# Patient Record
Sex: Female | Born: 1955
Health system: Southern US, Community
[De-identification: ages and names within clinical notes are randomized; demographics above are authoritative.]

## PROBLEM LIST (undated history)

## (undated) DIAGNOSIS — N951 Menopausal and female climacteric states: Secondary | ICD-10-CM

## (undated) DIAGNOSIS — E785 Hyperlipidemia, unspecified: Secondary | ICD-10-CM

## (undated) DIAGNOSIS — F419 Anxiety disorder, unspecified: Secondary | ICD-10-CM

## (undated) DIAGNOSIS — I10 Essential (primary) hypertension: Secondary | ICD-10-CM

## (undated) DIAGNOSIS — E119 Type 2 diabetes mellitus without complications: Secondary | ICD-10-CM

## (undated) DIAGNOSIS — G43909 Migraine, unspecified, not intractable, without status migrainosus: Secondary | ICD-10-CM

## (undated) DIAGNOSIS — G4700 Insomnia, unspecified: Secondary | ICD-10-CM

## (undated) HISTORY — DX: Insomnia, unspecified: G47.00

## (undated) HISTORY — DX: Menopausal and female climacteric states: N95.1

## (undated) HISTORY — PX: TUBAL LIGATION: SHX77

## (undated) HISTORY — DX: Migraine, unspecified, not intractable, without status migrainosus: G43.909

## (undated) HISTORY — DX: Type 2 diabetes mellitus without complications: E11.9

## (undated) HISTORY — DX: Hyperlipidemia, unspecified: E78.5

## (undated) HISTORY — DX: Essential (primary) hypertension: I10

## (undated) HISTORY — DX: Anxiety disorder, unspecified: F41.9

---

## 2002-11-14 ENCOUNTER — Encounter: Payer: Self-pay | Admitting: Internal Medicine

## 2004-02-28 HISTORY — PX: COLONOSCOPY W/ POLYPECTOMY: SHX1380

## 2004-06-22 ENCOUNTER — Encounter: Admission: RE | Admit: 2004-06-22 | Discharge: 2004-06-22 | Payer: Self-pay | Admitting: Obstetrics and Gynecology

## 2004-07-01 ENCOUNTER — Encounter: Admission: RE | Admit: 2004-07-01 | Discharge: 2004-07-01 | Payer: Self-pay | Admitting: Internal Medicine

## 2004-07-01 ENCOUNTER — Ambulatory Visit: Payer: Self-pay | Admitting: Internal Medicine

## 2004-07-04 ENCOUNTER — Ambulatory Visit: Payer: Self-pay | Admitting: Internal Medicine

## 2004-07-14 ENCOUNTER — Ambulatory Visit: Payer: Self-pay | Admitting: Internal Medicine

## 2004-07-28 ENCOUNTER — Ambulatory Visit: Payer: Self-pay | Admitting: Internal Medicine

## 2004-09-02 ENCOUNTER — Encounter (INDEPENDENT_AMBULATORY_CARE_PROVIDER_SITE_OTHER): Payer: Self-pay | Admitting: *Deleted

## 2004-09-02 ENCOUNTER — Ambulatory Visit (HOSPITAL_COMMUNITY): Admission: RE | Admit: 2004-09-02 | Discharge: 2004-09-02 | Payer: Self-pay | Admitting: Gastroenterology

## 2004-09-06 ENCOUNTER — Ambulatory Visit: Payer: Self-pay | Admitting: Internal Medicine

## 2005-01-24 ENCOUNTER — Ambulatory Visit: Payer: Self-pay | Admitting: Internal Medicine

## 2005-02-23 ENCOUNTER — Ambulatory Visit: Payer: Self-pay | Admitting: Internal Medicine

## 2005-02-27 HISTORY — PX: KNEE ARTHROSCOPY: SHX127

## 2005-08-17 ENCOUNTER — Encounter: Admission: RE | Admit: 2005-08-17 | Discharge: 2005-08-17 | Payer: Self-pay | Admitting: Obstetrics and Gynecology

## 2005-09-14 ENCOUNTER — Encounter: Admission: RE | Admit: 2005-09-14 | Discharge: 2005-09-14 | Payer: Self-pay | Admitting: Obstetrics and Gynecology

## 2005-12-26 ENCOUNTER — Ambulatory Visit: Payer: Self-pay | Admitting: Internal Medicine

## 2006-01-09 ENCOUNTER — Ambulatory Visit: Payer: Self-pay | Admitting: Internal Medicine

## 2006-01-09 LAB — CONVERTED CEMR LAB
ALT: 46 units/L — ABNORMAL HIGH (ref 0–40)
Albumin: 4.4 g/dL (ref 3.5–5.2)
Alkaline Phosphatase: 54 units/L (ref 39–117)
CO2: 30 meq/L (ref 19–32)
Calcium: 10 mg/dL (ref 8.4–10.5)
Chloride: 100 meq/L (ref 96–112)
Chol/HDL Ratio, serum: 4.3
Cholesterol: 141 mg/dL (ref 0–200)
Creatinine, Ser: 0.9 mg/dL (ref 0.4–1.2)
GFR calc non Af Amer: 70 mL/min
Glucose, Bld: 115 mg/dL — ABNORMAL HIGH (ref 70–99)
Potassium: 3.5 meq/L (ref 3.5–5.1)
Sodium: 140 meq/L (ref 135–145)

## 2006-04-08 IMAGING — CT CT HEAD W/O CM
1 series · 16 of 28 positions shown, 20 images · non-contrast
Comparison: none

CLINICAL DATA: Headache.  
 CT HEAD WITHOUT CONTRAST ? 07/01/04:
TECHNIQUE: Multidetector helical CT scanning was obtained through the head.
 No comparison films available.

[Series 2: brain · axial · 0.49mm/px · z∈[+51,+183]mm · 16 of 28 slices shown, 20 images]
[im 2/28  brain]
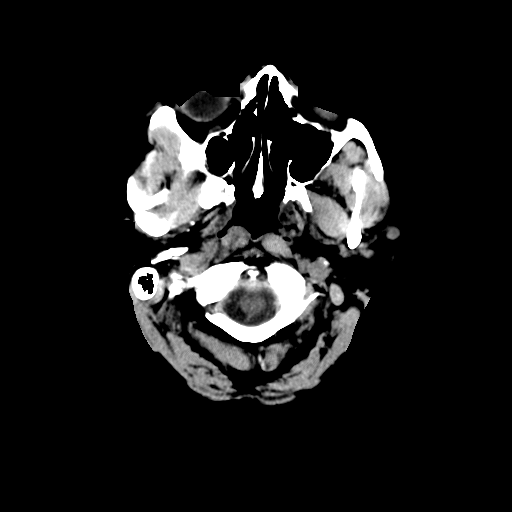
[im 2/28  bone]
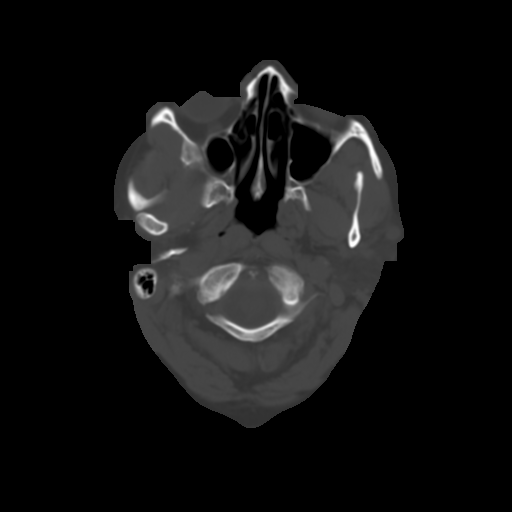
[im 4/28  brain]
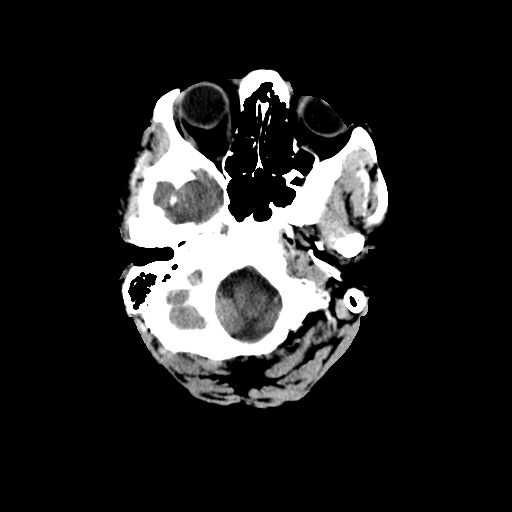
[im 6/28  brain]
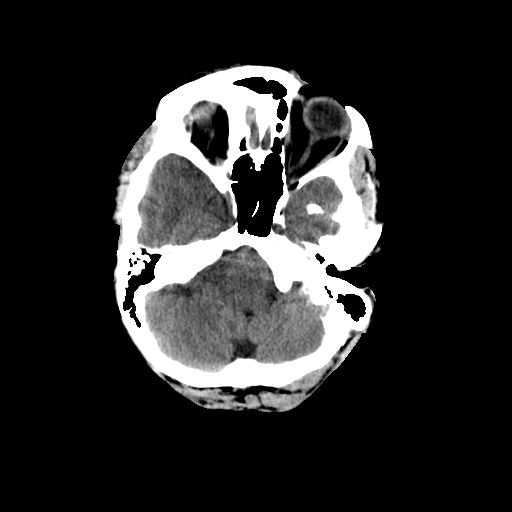
[im 7/28  brain]
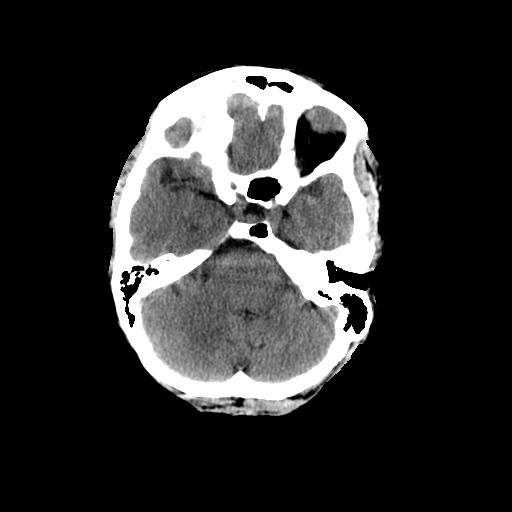
[im 9/28  brain]
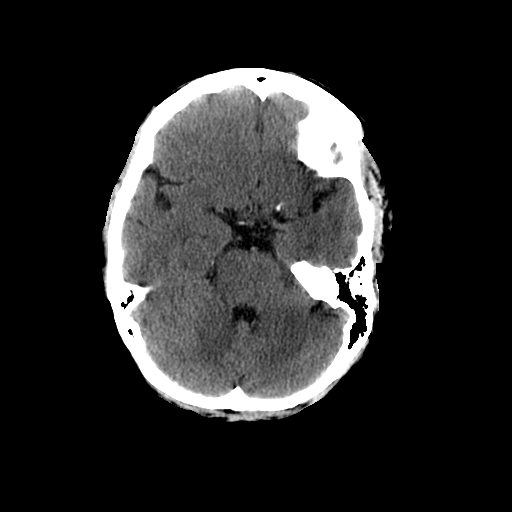
[im 9/28  bone]
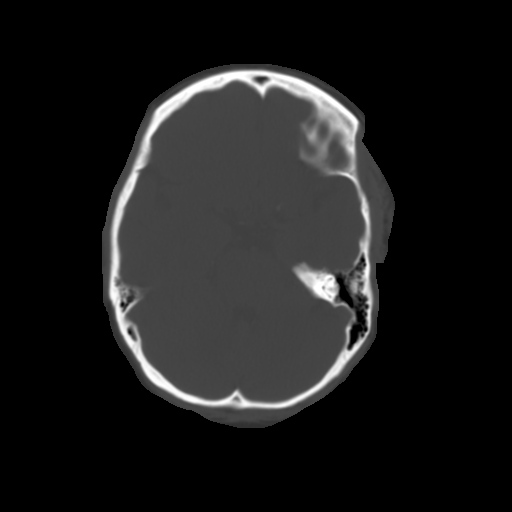
[im 10/28  brain]
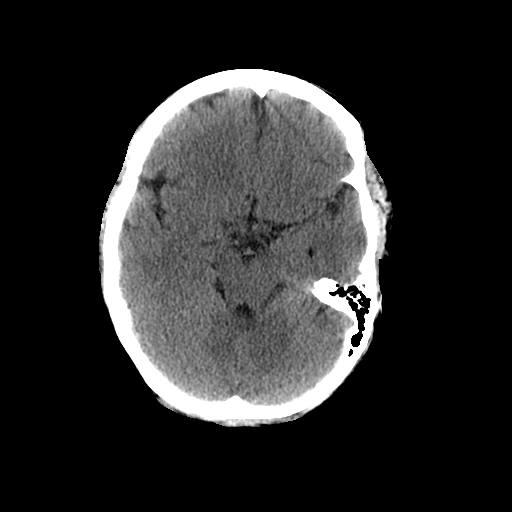
[im 12/28  brain]
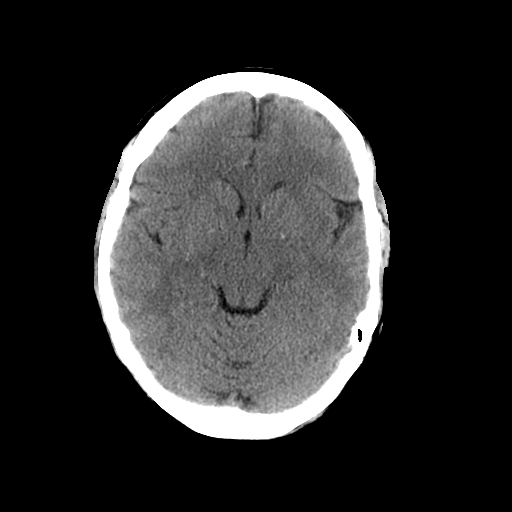
[im 14/28  brain]
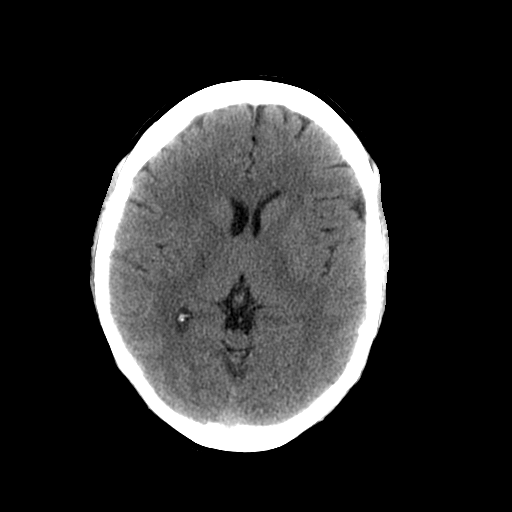
[im 15/28  brain]
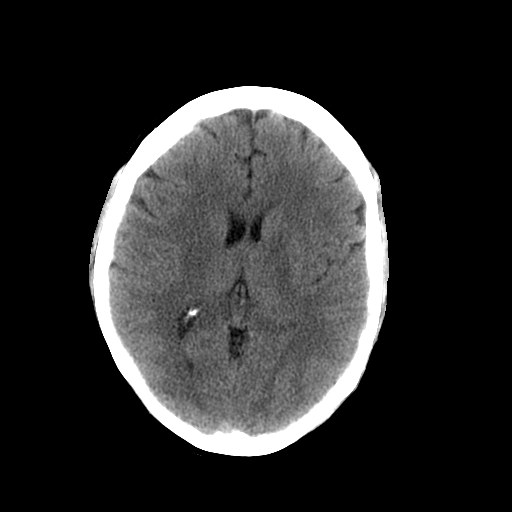
[im 15/28  bone]
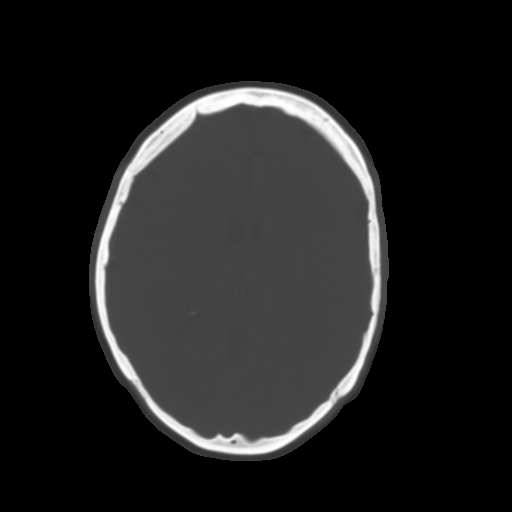
[im 17/28  brain]
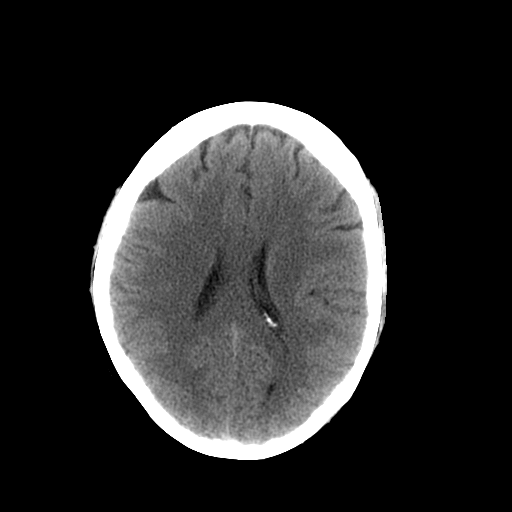
[im 19/28  brain]
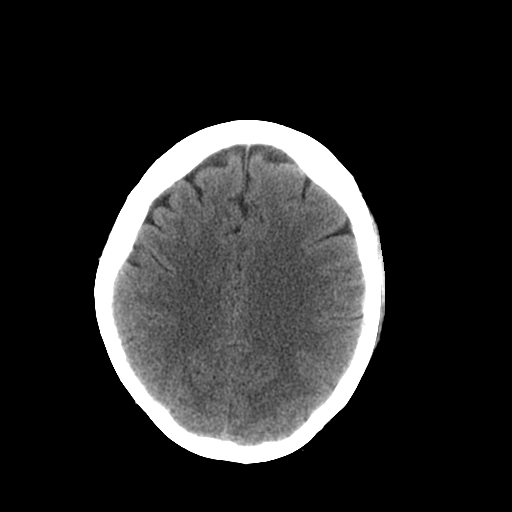
[im 20/28  brain]
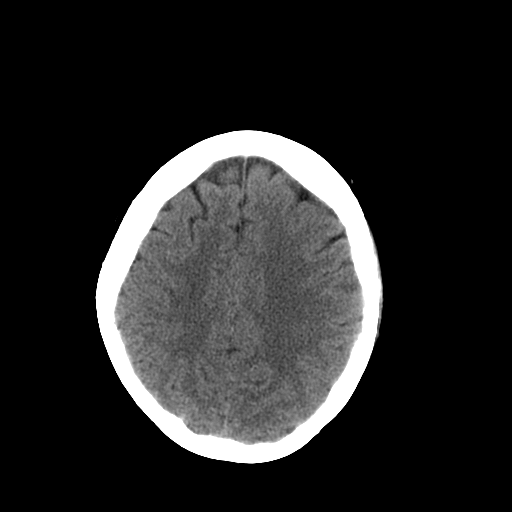
[im 22/28  brain]
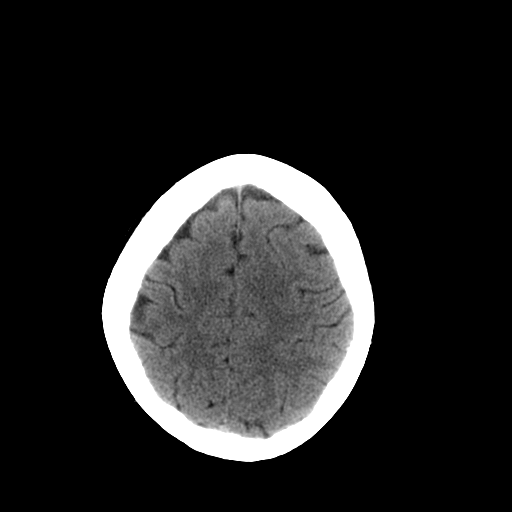
[im 22/28  bone]
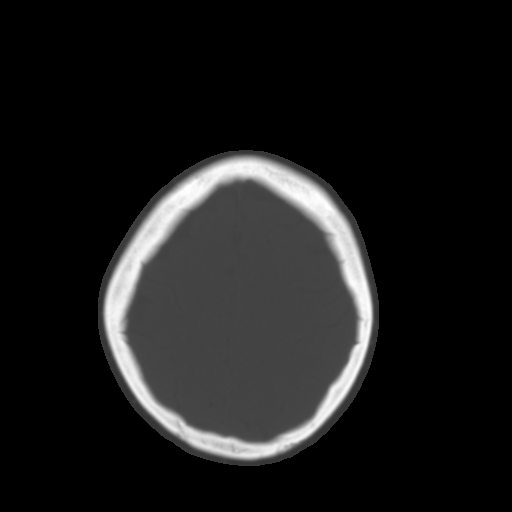
[im 23/28  brain]
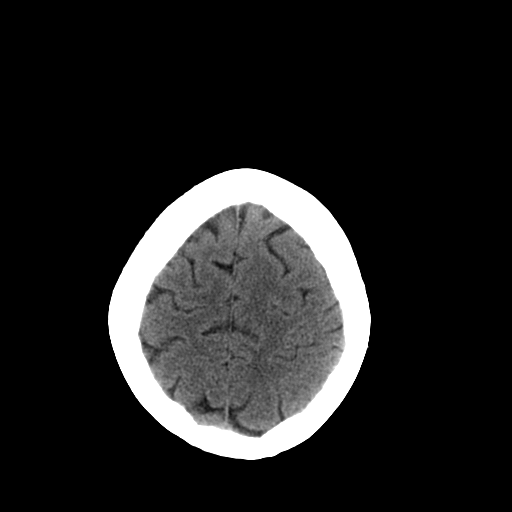
[im 25/28  brain]
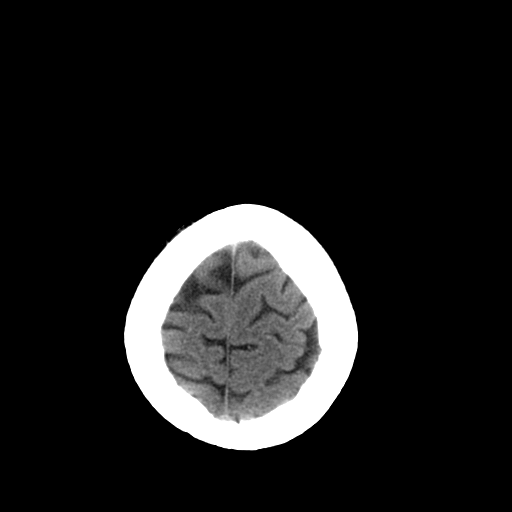
[im 27/28  brain]
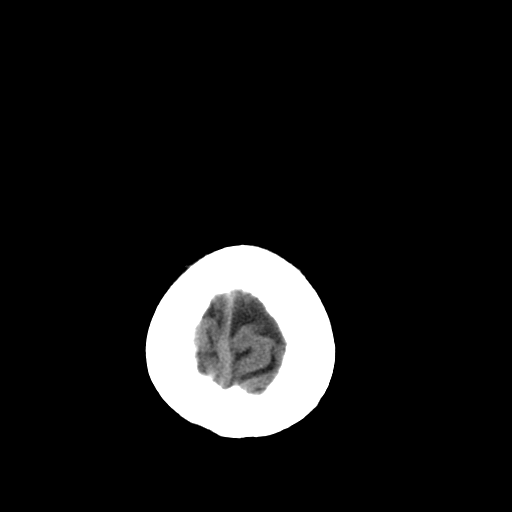

[16 of 28 positions shown; findings below may reference images not displayed]

FINDINGS: No evidence of acute intracranial abnormality including mass or mass effect, hydrocephalus, extra-axial fluid collection, midline shift, hemorrhage, or infarct.   Acute infarct may be missed by CT for 24-48 hours. 
 The visualized bony calvarium and paranasal sinuses are unremarkable.
IMPRESSION: No evidence of acute intracranial abnormality.

## 2006-04-09 ENCOUNTER — Ambulatory Visit: Payer: Self-pay | Admitting: Internal Medicine

## 2006-04-09 LAB — CONVERTED CEMR LAB
BUN: 22 mg/dL (ref 6–23)
CO2: 33 meq/L — ABNORMAL HIGH (ref 19–32)
Calcium: 9.5 mg/dL (ref 8.4–10.5)
Chloride: 97 meq/L (ref 96–112)
Creatinine, Ser: 0.7 mg/dL (ref 0.4–1.2)
Glucose, Bld: 96 mg/dL (ref 70–99)
Potassium: 3.7 meq/L (ref 3.5–5.1)
Sodium: 139 meq/L (ref 135–145)

## 2006-09-12 ENCOUNTER — Encounter: Admission: RE | Admit: 2006-09-12 | Discharge: 2006-09-12 | Payer: Self-pay | Admitting: Obstetrics and Gynecology

## 2006-12-28 ENCOUNTER — Encounter (INDEPENDENT_AMBULATORY_CARE_PROVIDER_SITE_OTHER): Payer: Self-pay | Admitting: *Deleted

## 2007-04-16 ENCOUNTER — Ambulatory Visit: Payer: Self-pay | Admitting: Internal Medicine

## 2007-04-16 ENCOUNTER — Telehealth (INDEPENDENT_AMBULATORY_CARE_PROVIDER_SITE_OTHER): Payer: Self-pay | Admitting: *Deleted

## 2007-04-16 DIAGNOSIS — G4700 Insomnia, unspecified: Secondary | ICD-10-CM

## 2007-04-16 DIAGNOSIS — F411 Generalized anxiety disorder: Secondary | ICD-10-CM

## 2007-04-16 DIAGNOSIS — I10 Essential (primary) hypertension: Secondary | ICD-10-CM

## 2007-04-16 DIAGNOSIS — N951 Menopausal and female climacteric states: Secondary | ICD-10-CM

## 2007-04-16 DIAGNOSIS — G43909 Migraine, unspecified, not intractable, without status migrainosus: Secondary | ICD-10-CM | POA: Insufficient documentation

## 2007-04-16 DIAGNOSIS — E785 Hyperlipidemia, unspecified: Secondary | ICD-10-CM

## 2007-05-24 ENCOUNTER — Telehealth (INDEPENDENT_AMBULATORY_CARE_PROVIDER_SITE_OTHER): Payer: Self-pay | Admitting: *Deleted

## 2007-06-19 ENCOUNTER — Ambulatory Visit: Payer: Self-pay | Admitting: Internal Medicine

## 2007-06-25 ENCOUNTER — Telehealth (INDEPENDENT_AMBULATORY_CARE_PROVIDER_SITE_OTHER): Payer: Self-pay | Admitting: *Deleted

## 2007-06-26 ENCOUNTER — Telehealth (INDEPENDENT_AMBULATORY_CARE_PROVIDER_SITE_OTHER): Payer: Self-pay | Admitting: *Deleted

## 2007-06-26 DIAGNOSIS — R739 Hyperglycemia, unspecified: Secondary | ICD-10-CM

## 2007-07-18 ENCOUNTER — Encounter: Admission: RE | Admit: 2007-07-18 | Discharge: 2007-07-18 | Payer: Self-pay | Admitting: Internal Medicine

## 2007-07-25 ENCOUNTER — Ambulatory Visit: Payer: Self-pay | Admitting: Cardiovascular Disease

## 2007-07-28 ENCOUNTER — Encounter: Payer: Self-pay | Admitting: Internal Medicine

## 2007-08-05 ENCOUNTER — Ambulatory Visit: Payer: Self-pay

## 2007-08-05 ENCOUNTER — Encounter: Payer: Self-pay | Admitting: Cardiovascular Disease

## 2007-08-05 ENCOUNTER — Encounter: Payer: Self-pay | Admitting: Internal Medicine

## 2007-10-09 ENCOUNTER — Encounter: Admission: RE | Admit: 2007-10-09 | Discharge: 2007-10-09 | Payer: Self-pay | Admitting: Internal Medicine

## 2007-10-11 ENCOUNTER — Encounter (INDEPENDENT_AMBULATORY_CARE_PROVIDER_SITE_OTHER): Payer: Self-pay | Admitting: *Deleted

## 2007-10-14 ENCOUNTER — Ambulatory Visit: Payer: Self-pay | Admitting: Cardiovascular Disease

## 2007-10-14 ENCOUNTER — Ambulatory Visit: Payer: Self-pay

## 2007-11-25 ENCOUNTER — Encounter (INDEPENDENT_AMBULATORY_CARE_PROVIDER_SITE_OTHER): Payer: Self-pay | Admitting: *Deleted

## 2007-11-28 ENCOUNTER — Ambulatory Visit: Payer: Self-pay | Admitting: Internal Medicine

## 2007-11-28 LAB — CONVERTED CEMR LAB
ALT: 40 units/L — ABNORMAL HIGH (ref 0–35)
AST: 24 units/L (ref 0–37)
Cholesterol: 125 mg/dL (ref 0–200)
HDL: 41 mg/dL (ref >39.0)
Triglycerides: 89 mg/dL (ref 0–149)

## 2008-05-13 DIAGNOSIS — I498 Other specified cardiac arrhythmias: Secondary | ICD-10-CM

## 2008-05-13 DIAGNOSIS — Z9189 Other specified personal risk factors, not elsewhere classified: Secondary | ICD-10-CM

## 2008-05-13 DIAGNOSIS — R011 Cardiac murmur, unspecified: Secondary | ICD-10-CM | POA: Insufficient documentation

## 2008-05-13 DIAGNOSIS — R609 Edema, unspecified: Secondary | ICD-10-CM

## 2008-05-13 DIAGNOSIS — E7800 Pure hypercholesterolemia, unspecified: Secondary | ICD-10-CM

## 2008-05-14 ENCOUNTER — Ambulatory Visit: Payer: Self-pay | Admitting: Cardiovascular Disease

## 2008-05-20 ENCOUNTER — Telehealth (INDEPENDENT_AMBULATORY_CARE_PROVIDER_SITE_OTHER): Payer: Self-pay | Admitting: *Deleted

## 2008-06-08 ENCOUNTER — Telehealth: Payer: Self-pay | Admitting: Cardiovascular Disease

## 2008-07-08 ENCOUNTER — Telehealth: Payer: Self-pay | Admitting: Cardiovascular Disease

## 2008-11-10 ENCOUNTER — Telehealth (INDEPENDENT_AMBULATORY_CARE_PROVIDER_SITE_OTHER): Payer: Self-pay | Admitting: *Deleted

## 2008-11-16 ENCOUNTER — Ambulatory Visit: Payer: Self-pay | Admitting: Internal Medicine

## 2008-11-18 ENCOUNTER — Encounter (INDEPENDENT_AMBULATORY_CARE_PROVIDER_SITE_OTHER): Payer: Self-pay | Admitting: *Deleted

## 2008-11-18 LAB — CONVERTED CEMR LAB
ALT: 39 units/L — ABNORMAL HIGH (ref 0–35)
AST: 29 units/L (ref 0–37)
Chloride: 105 meq/L (ref 96–112)
Cholesterol: 191 mg/dL (ref 0–200)
HDL: 48.5 mg/dL (ref >39.00)
Hemoglobin: 15 g/dL (ref 12.0–15.0)
Sodium: 144 meq/L (ref 135–145)
TSH: 2.83 microintl units/mL (ref 0.35–5.50)
Total CHOL/HDL Ratio: 4
Triglycerides: 97 mg/dL (ref 0.0–149.0)
VLDL: 19.4 mg/dL (ref 0.0–40.0)

## 2009-02-12 ENCOUNTER — Encounter: Admission: RE | Admit: 2009-02-12 | Discharge: 2009-02-12 | Payer: Self-pay | Admitting: Obstetrics and Gynecology

## 2009-03-24 ENCOUNTER — Encounter (INDEPENDENT_AMBULATORY_CARE_PROVIDER_SITE_OTHER): Payer: Self-pay | Admitting: *Deleted

## 2009-05-17 ENCOUNTER — Ambulatory Visit: Payer: Self-pay | Admitting: Internal Medicine

## 2009-06-03 ENCOUNTER — Telehealth: Payer: Self-pay | Admitting: Internal Medicine

## 2009-06-04 ENCOUNTER — Telehealth (INDEPENDENT_AMBULATORY_CARE_PROVIDER_SITE_OTHER): Payer: Self-pay | Admitting: *Deleted

## 2009-06-10 ENCOUNTER — Ambulatory Visit: Payer: Self-pay | Admitting: Cardiovascular Disease

## 2009-08-27 ENCOUNTER — Emergency Department (HOSPITAL_COMMUNITY): Admission: EM | Admit: 2009-08-27 | Discharge: 2009-08-27 | Payer: Self-pay | Admitting: Emergency Medicine

## 2010-01-12 ENCOUNTER — Telehealth: Payer: Self-pay | Admitting: Cardiovascular Disease

## 2010-01-18 ENCOUNTER — Telehealth: Payer: Self-pay | Admitting: Cardiovascular Disease

## 2010-01-25 ENCOUNTER — Telehealth: Payer: Self-pay | Admitting: Cardiovascular Disease

## 2010-03-20 ENCOUNTER — Encounter: Payer: Self-pay | Admitting: Obstetrics and Gynecology

## 2010-03-27 LAB — CONVERTED CEMR LAB
AST: 39 units/L — ABNORMAL HIGH (ref 0–37)
Basophils Absolute: 0 10*3/uL (ref 0.0–0.1)
CO2: 26 meq/L (ref 19–32)
Chloride: 105 meq/L (ref 96–112)
Creatinine, Ser: 0.8 mg/dL (ref 0.4–1.2)
Eosinophils Absolute: 0.1 10*3/uL (ref 0.0–0.7)
Eosinophils Relative: 1.1 % (ref 0.0–5.0)
GFR calc non Af Amer: 80 mL/min
HCT: 43.5 % (ref 36.0–46.0)
Hgb A1c MFr Bld: 5.9 % (ref 4.6–6.0)
LDL Cholesterol: 101 mg/dL — ABNORMAL HIGH (ref 0–99)
Monocytes Absolute: 0.5 10*3/uL (ref 0.1–1.0)
Monocytes Relative: 8.2 % (ref 3.0–12.0)
Neutro Abs: 3.8 10*3/uL (ref 1.4–7.7)
Neutrophils Relative %: 58.5 % (ref 43.0–77.0)
RBC: 4.76 M/uL (ref 3.87–5.11)
RDW: 12.2 % (ref 11.5–14.6)
Sodium: 141 meq/L (ref 135–145)
Total CHOL/HDL Ratio: 4.3
Triglycerides: 110 mg/dL (ref 0–149)
VLDL: 22 mg/dL (ref 0–40)
WBC: 6.5 10*3/uL (ref 4.5–10.5)

## 2010-03-29 NOTE — Progress Notes (Signed)
Summary: refill  Phone Note Refill Request Message from:  Patient on January 18, 2010 1:34 PM  Refills Requested: Medication #1:  METOPROLOL TARTRATE 100 MG  TABS (METOPROLOL TARTRATE) 1 TAB AM 1/2 TAB PM  Medication #2:  TRIAMTERENE-HCTZ 37.5-25 MG TABS 1 by mouth once daily  Medication #3:  AMLODIPINE BESYLATE 10 MG TABS 1 a day  Medication #4:  CRESTOR 10 MG TABS 1 by mouth at bedtime Send to [facility]  Initial call taken by: Judie Grieve,  January 18, 2010 1:35 PM  Follow-up for Phone Call        these meds has already been sent to the pharmacy...tried to call pt back but there is a busy signal that keeps beeping when i call Follow-up by: Kem Parkinson,  January 18, 2010 1:46 PM

## 2010-03-29 NOTE — Progress Notes (Signed)
Summary: refill request  Phone Note Refill Request Message from:  Patient on January 12, 2010 4:05 PM  Refills Requested: Medication #1:  CRESTOR 10 MG TABS 1 by mouth at bedtime  Medication #2:  AMLODIPINE BESYLATE 10 MG TABS 1 a day  Medication #3:  TRIAMTERENE-HCTZ 37.5-25 MG TABS 1 by mouth once daily  Medication #4:  METOPROLOL TARTRATE 100 MG  TABS (METOPROLOL TARTRATE) 1 TAB AM 1/2 TAB PM [facility] mail order   Method Requested: Telephone to Pharmacy Initial call taken by: Glynda Jaeger,  January 12, 2010 4:06 PM    Prescriptions: AMLODIPINE BESYLATE 10 MG TABS (AMLODIPINE BESYLATE) 1 a day  #90 x 3   Entered by:   Kem Parkinson   Authorized by:   Colon Branch, MD, [facility]   Signed by:   Kem Parkinson on 01/12/2010   Method used:   Faxed to ...       [facility] (mail-order)             , [place]         Ph: [phone]       Fax: [phone]   RxID:   [id number] TRIAMTERENE-HCTZ 37.5-25 MG TABS (TRIAMTERENE-HCTZ) 1 by mouth once daily  #90 x 3   Entered by:   Kem Parkinson   Authorized by:   Colon Branch, MD, [facility]   Signed by:   Kem Parkinson on 01/12/2010   Method used:   Faxed to ...       [facility] (mail-order)             , [place]         Ph: [phone]       Fax: [phone]   RxID:   [id number] CRESTOR 10 MG TABS (ROSUVASTATIN CALCIUM) 1 by mouth at bedtime  #90 x 3   Entered by:   Kem Parkinson   Authorized by:   Colon Branch, MD, [facility]   Signed by:   Kem Parkinson on 01/12/2010   Method used:   Faxed to ...       [facility] (mail-order)             , [place]         Ph: [phone]       Fax: [phone]   RxID:   [id number] METOPROLOL TARTRATE 100 MG  TABS (METOPROLOL TARTRATE) 1 TAB AM 1/2 TAB PM  #180 x 3   Entered by:   Kem Parkinson   Authorized by:   Colon Branch, MD, [facility]   Signed by:   Kem Parkinson on 01/12/2010   Method used:   Faxed to ...       [facility] (mail-order)             , [place]         Ph: [phone]       Fax: [phone]   RxID:   [id number]

## 2010-03-29 NOTE — Assessment & Plan Note (Signed)
Summary: f1y/dm   CC:  check up.  History of Present Illness: [name] returns today for followup.  She has had atypical chest pain in the past with normal Myoview.  She has had longstanding hypertension. Her last echo done August 05, 2007, showed overall normal LV function.  Her septal thickness was only 6-8 mm.  Her cholesterol is well Rx with crestor.  She is not having any recurrent chest pain.   Current Problems (verified): 1)  Chest Pain, Atypical, Hx of  (ICD-V15.89) 2)  Hypercholesterolemia  (ICD-272.0) 3)  Edema  (ICD-782.3) 4)  Bradycardia  (ICD-427.89) 5)  Systolic Murmur  (ICD-785.2) 6)  Diabetes Mellitus, Borderline  (ICD-790.29) 7)  Hypertension  (ICD-401.9) 8)  Insomnia-sleep Disorder-unspec  (ICD-307.40) 9)  Hyperlipidemia  (ICD-272.4) 10)  Migraine Headache  (ICD-346.90) 11)  Anxiety  (ICD-300.00) 12)  Perimenopausal Status  (ICD-627.2) 13)  Tubal Ligation, Hx of  (ICD-V26.51) 14)  Health Screening  (ICD-V70.0)  Current Medications (verified): 1)  Metoprolol Tartrate 100 Mg  Tabs (Metoprolol Tartrate) .Marland Kitchen.. 1 Tab Am 1/2 Tab Pm 2)  Baby Aspirin 81 Mg Chew (Aspirin) .... Take 1 Tablet By Mouth Once A Day 3)  Crestor 10 Mg Tabs (Rosuvastatin Calcium) .Marland Kitchen.. 1 By Mouth At Bedtime 4)  Triamterene-Hctz 37.5-25 Mg Tabs (Triamterene-Hctz) .Marland Kitchen.. 1 By Mouth Once Daily 5)  Doxepin Hcl 10 Mg/ml Conc (Doxepin Hcl) .... 1/2 To 1 Cc At Bedtime 6)  Amlodipine Besylate 10 Mg Tabs (Amlodipine Besylate) .Marland Kitchen.. 1 A Day 7)  Clonidine Hcl 0.2 Mg Tabs (Clonidine Hcl) .Marland Kitchen.. 1 By Mouth Two Times A Day  Allergies (verified): No Known Drug Allergies  Past History:  Past Medical History: Last updated: 05/17/2009 ++ FH CAD atypical CP: 07-2007 neg stress test, normal EF by ECHO Borderline DM 4-09 MIGRAINE HEADACHE  HYPERTENSION  HYPERLIPIDEMIA  PERIMENOPAUSAL ANXIETY INSOMNIA-SLEEP DISORDER-UNSPEC (ICD-307.40)  Past Surgical History: Last updated: 05/13/2008 Colon  polypectomy-2006 KNEE SCOPE-2007 Tubal ligation C-section  Family History: Last updated: 11/16/2008 Family History of CAD Female 1st degree relative <60 MOTHER 50'S Family History of CAD Female 1st degree relative <50 FATHER-40'S DM - M, bro x2, sis stroke - no colon Ca - no breast ca - no M - living - poor health F - deceased MI (57 y/o)  Social History: Last updated: 05/17/2009 Mar[place] original [place] 2 sons and 1 daughter Never Smoked Alcohol use-no Drug use-no Regular exercise- none  Review of Systems       Denies fever, malais, weight loss, blurry vision, decreased visual acuity, cough, sputum, SOB, hemoptysis, pleuritic pain, palpitaitons, heartburn, abdominal pain, melena, lower extremity edema, claudication, or rash.   Vital Signs:  Patient profile:   55 year old female Height:      59 inches Weight:      158 pounds BMI:     32.03 Pulse rate:   66 / minute Resp:     14 per minute BP sitting:   122 / 80  (left arm)  Vitals Entered By: Kem Parkinson (June 10, 2009 10:22 AM)  Physical Exam  General:  Affect appropriate Healthy:  appears stated age HEENT: normal Neck supple with no adenopathy JVP normal no bruits no thyromegaly Lungs clear with no wheezing and good diaphragmatic motion Heart:  S1/S2 no murmur,rub, gallop or click PMI normal Abdomen: benighn, BS positve, no tenderness, no AAA no bruit.  No HSM or HJR Distal pulses intact with no bruits No edema Neuro non-focal Skin warm and dry  Impression & Recommendations:  Problem # 1:  CHEST PAIN, ATYPICAL, HX OF (ICD-V15.89) non recurrent, normal ECG normal myovue 2009  Problem # 2:  HYPERCHOLESTEROLEMIA (ICD-272.0) With no history of vascular disease she is at goal Her updated medication list for this problem includes:    Crestor 10 Mg Tabs (Rosuvastatin calcium) .Marland Kitchen... 1 by mouth at bedtime  CHOL: 191 (11/16/2008)   LDL: 123 (11/16/2008)   HDL: 48.50 (11/16/2008)   TG:  97.0 (11/16/2008)  Problem # 5:  HYPERTENSION (ICD-401.9) Well controlled Her updated medication list for this problem includes:    Baby Aspirin 81 Mg Chew (Aspirin) .Marland Kitchen... Take 1 tablet by mouth once a day    Triamterene-hctz 37.5-25 Mg Tabs (Triamterene-hctz) .Marland Kitchen... 1 by mouth once daily    Amlodipine Besylate 10 Mg Tabs (Amlodipine besylate) .Marland Kitchen... 1 a day    Clonidine Hcl 0.2 Mg Tabs (Clonidine hcl) .Marland Kitchen... 1 by mouth two times a day   EKG Report  Procedure date:  06/10/2009  Findings:      NSR Normal ECG

## 2010-03-29 NOTE — Progress Notes (Signed)
Summary: Plan does not cover Silenor  Phone Note Refill Request Message from:  Fax from Pharmacy on June 04, 2009 3:18 PM  Refills Requested: Medication #1:  SILENOR 3 MG TABS 1 or 2 by mouth at bedtime as needed plan does not cover this medication. They prefer Zolpidem,Temazepam. Please call plan at [phone] to initiate prior authoir   Method Requested: Fax to Local Pharmacy Next Appointment Scheduled: no appt Initial call taken by: Barb Merino,  June 04, 2009 3:19 PM  Follow-up for Phone Call        Are any of these an option for this patient?  Additional Follow-up for Phone Call Additional follow up Details #1::        options: --pay out of pocket for silenor --try doxepin 10mg /1cc: 1/2 to 1  cc at bedtime (this is a generic, she needs to get a way of measuring the  CCs accurrately) --go back to the other meds she was on before  Wells Branch E. Paz MD  June 07, 2009 11:17 AM      Additional Follow-up for Phone Call Additional follow up Details #2::    Spoke with pt husband informed, they will try the doxepin,and will buy a measurement at pharmacy .Kandice Hams  June 07, 2009 2:19 PM  Follow-up by: Kandice Hams,  June 07, 2009 2:19 PM  New/Updated Medications: DOXEPIN HCL 10 MG/ML CONC (DOXEPIN HCL) 1/2 to 1 cc at bedtime Prescriptions: DOXEPIN HCL 10 MG/ML CONC (DOXEPIN HCL) 1/2 to 1 cc at bedtime  #1 x 0   Entered by:   Kandice Hams   Authorized by:   Nolon Rod. Paz MD   Signed by:   Kandice Hams on 06/07/2009   Method used:   Telephoned to ...       [facility] [address] [phone]* (retail)       [address]       [place], [place]  [address]       Ph: [phone] or [phone]       Fax: [phone]   RxID:   [id number]

## 2010-03-29 NOTE — Letter (Signed)
Summary: Appointment - Reminder 2  [facility], Main Office  [address]   [place], [place] [address]   Phone: [phone]  Fax: [phone]     March 24, 2009 MRN: [id number]   [facility] [address] [place], [place]  [address]   Dear Ms. ARROYO,  Our records indicate that it is time to schedule a follow-up appointment with Dr. Eden Emms. It is very important that we reach you to schedule this appointment. We look forward to participating in your health care needs. Please contact us at the number listed above at your earliest convenience to schedule your appointment.  If you are unable to make an appointment at this time, give [place] a call so we can update our records.  Sincerely,   Migdalia Dk [facility] Scheduling Team

## 2010-03-29 NOTE — Assessment & Plan Note (Signed)
Summary: 6 MONTH OV//PH   Vital Signs:  Patient profile:   55 year old female Height:      59.5 inches Weight:      153.2 pounds BMI:     30.53 Pulse rate:   76 / minute BP sitting:   158 / 80  Vitals Entered By: Shary Decamp (May 17, 2009 8:54 AM) CC: rov , bp @ home 130's/70's   History of Present Illness: DM-- no ambulatory CBGs , diet is good, exercise  some   HYPERTENSION -- ambulatory BPs 130/ 78, good medication compliance   HYPERLIPIDEMIA -- on crestor, has noted upper back pain x few days otherwise no myalgias      Current Medications (verified): 1)  Metoprolol Tartrate 100 Mg  Tabs (Metoprolol Tartrate) .Marland Kitchen.. 1 Tab Am 1/2 Tab Pm 2)  Baby Aspirin 81 Mg Chew (Aspirin) .... Take 1 Tablet By Mouth Once A Day 3)  Crestor 10 Mg Tabs (Rosuvastatin Calcium) .Marland Kitchen.. 1 By Mouth At Bedtime 4)  Triamterene-Hctz 37.5-25 Mg Tabs (Triamterene-Hctz) .Marland Kitchen.. 1 By Mouth Once Daily 5)  Restoril 7.5 Mg Caps (Temazepam) .Marland Kitchen.. 1 or 2 By Mouth At Bedtime As Needed Insomnia 6)  Amlodipine Besylate 10 Mg Tabs (Amlodipine Besylate) .Marland Kitchen.. 1 A Day 7)  Clonidine Hcl 0.2 Mg Tabs (Clonidine Hcl) .Marland Kitchen.. 1 By Mouth Two Times A Day  Allergies (verified): No Known Drug Allergies  Past History:  Past Medical History: ++ FH CAD atypical CP: 07-2007 neg stress test, normal EF by ECHO Borderline DM 4-09 MIGRAINE HEADACHE  HYPERTENSION  HYPERLIPIDEMIA  PERIMENOPAUSAL ANXIETY INSOMNIA-SLEEP DISORDER-UNSPEC (ICD-307.40)  Past Surgical History: Reviewed history from 05/13/2008 and no changes required. Colon polypectomy-2006 KNEE SCOPE-2007 Tubal ligation C-section  Social History: Married original from [place] 2 sons and 1 daughter Never Smoked Alcohol use-no Drug use-no Regular exercise- none  Review of Systems       occasionally has lower extremity edema at the end of the day GI:  Denies diarrhea, nausea, and vomiting. GU:  Denies dysuria and hematuria. Psych:  mood "ok" still has  problems w/ sleep, as soon as goes to bed starts "thinking about things"  denies anxiety or depression perse .  Physical Exam  General:  alert, well-developed, and well-nourished.   Lungs:  normal respiratory effort, no intercostal retractions, no accessory muscle use, and normal breath sounds.   Heart:  normal rate, regular rhythm, no murmur, and no gallop.   Extremities:  no pretibial edema bilaterally    Impression & Recommendations:  Problem # 1:  HYPERCHOLESTEROLEMIA (ICD-272.0) no change for now Her updated medication list for this problem includes:    Crestor 10 Mg Tabs (Rosuvastatin calcium) .Marland Kitchen... 1 by mouth at bedtime  Labs Reviewed: SGOT: 29 (11/16/2008)   SGPT: 39 (11/16/2008)   HDL:48.50 (11/16/2008), 41.0 (11/28/2007)  LDL:123 (11/16/2008), 66 (16/11/9602)  Chol:191 (11/16/2008), 125 (11/28/2007)  Trig:97.0 (11/16/2008), 89 (11/28/2007)  Problem # 2:  DIABETES MELLITUS, BORDERLINE (ICD-790.29)  encourage diet and exercise Labs Reviewed: Creat: 0.7 (11/16/2008)     Problem # 3:  HYPERTENSION (ICD-401.9) ambulatory BPs well-controlled Her updated medication list for this problem includes:    Triamterene-hctz 37.5-25 Mg Tabs (Triamterene-hctz) .Marland Kitchen... 1 by mouth once daily    Amlodipine Besylate 10 Mg Tabs (Amlodipine besylate) .Marland Kitchen... 1 a day    Clonidine Hcl 0.2 Mg Tabs (Clonidine hcl) .Marland Kitchen... 1 by mouth two times a day  BP today: 158/80 Prior BP: 130/80 (11/16/2008)  Labs Reviewed: K+: 3.6 (11/16/2008) Creat: :  0.7 (11/16/2008)   Chol: 191 (11/16/2008)   HDL: 48.50 (11/16/2008)   LDL: 123 (11/16/2008)   TG: 97.0 (11/16/2008)  Problem # 4:  INSOMNIA-SLEEP DISORDER-UNSPEC (ICD-307.40) still having problems with Restoril printed material provided regards good sleeping habits trial w/  silenor 3mg , #6 samples provided, will call if needs a Rx   Problem # 5:  HEALTH SCREENING (ICD-V70.0) report a recent normal mammogram sees gyn ,  records will see Dr Cherly Hensen  maximum Cscope 2006: tubular adenomas, Dr Loreta [address], was told to repeat in 5 years  Td 2004    Complete Medication List: 1)  Metoprolol Tartrate 100 Mg Tabs (metoprolol Tartrate)  .Marland Kitchen.. 1 tab am 1/2 tab pm 2)  Baby Aspirin 81 Mg Chew (Aspirin) .... Take 1 tablet by mouth once a day 3)  Crestor 10 Mg Tabs (Rosuvastatin calcium) .Marland Kitchen.. 1 by mouth at bedtime 4)  Triamterene-hctz 37.5-25 Mg Tabs (Triamterene-hctz) .Marland Kitchen.. 1 by mouth once daily 5)  Silenor 3 Mg Tabs (Doxepin hcl) .Marland Kitchen.. 1 or 2 by mouth at bedtime as needed 6)  Amlodipine Besylate 10 Mg Tabs (Amlodipine besylate) .Marland Kitchen.. 1 a day 7)  Clonidine Hcl 0.2 Mg Tabs (Clonidine hcl) .Marland Kitchen.. 1 by mouth two times a day  Patient Instructions: 1)  silenor 1 or 2 at bedtime 2)  Please schedule a follow-up appointment in 4 months (fasting) Prescriptions: AMLODIPINE BESYLATE 10 MG TABS (AMLODIPINE BESYLATE) 1 a day  #90 x 1   Entered by:   Shary Decamp   Authorized by:   Nolon Rod. Heiress Williamson MD   Signed by:   Shary Decamp on 05/17/2009   Method used:   Electronically to        [facility]* (mail-order)             ,          Ph: [phone]       Fax: [phone]   RxID:   [id number] TRIAMTERENE-HCTZ 37.5-25 MG TABS (TRIAMTERENE-HCTZ) 1 by mouth once daily  #90 x 1   Entered by:   Shary Decamp   Authorized by:   Nolon Rod. Claborn Janusz MD   Signed by:   Shary Decamp on 05/17/2009   Method used:   Electronically to        [facility]* (mail-order)             ,          Ph: [phone]       Fax: [phone]   RxID:   [id number]     Vital Signs: Weight: 153.2 lbs.  Pulse Rate: 76  Blood Pressure:  158 / 80

## 2010-03-29 NOTE — Progress Notes (Signed)
Summary: refill  Phone Note Refill Request Call back at [phone] Message from:  Patient husband on June 03, 2009 1:39 PM  Refills Requested: Medication #1:  SILENOR 3 MG TABS 1 or 2 by mouth at bedtime as needed Patient husband says the medication is reacting good.  Please call when this is done. [facility] [address]   Method Requested: Telephone to Pharmacy Next Appointment Scheduled: June 10, 2009 Initial call taken by: Barnie Mort,  June 03, 2009 1:40 PM  Follow-up for Phone Call        ok call 60 and 4 RF Jose E. Paz MD  June 04, 2009 8:24 AM     Prescriptions: SILENOR 3 MG TABS (DOXEPIN HCL) 1 or 2 by mouth at bedtime as needed  #60 x 4   Entered by:   Shary Decamp   Authorized by:   Nolon Rod. Paz MD   Signed by:   Shary Decamp on 06/04/2009   Method used:   Electronically to        [facility] [address] [phone]* (retail)       [address]       [place], [place]  [address]       Ph: [phone] or [phone]       Fax: [phone]   RxID:   [id number]

## 2010-03-29 NOTE — Progress Notes (Signed)
Summary: refill meds  Phone Note Refill Request Call back at Home Phone [phone] Message from:  Patient on January 25, 2010 12:51 PM  Refills Requested: Medication #1:  CRESTOR 10 MG TABS 1 by mouth at bedtime   Supply Requested: 3 months  Medication #2:  AMLODIPINE BESYLATE 10 MG TABS 1 a day   Supply Requested: 3 months  Medication #3:  TRIAMTERENE-HCTZ 37.5-25 MG TABS 1 by mouth once daily   Supply Requested: 3 months  Medication #4:  METOPROLOL TARTRATE 100 MG  TABS (METOPROLOL TARTRATE) 1 TAB AM 1/2 TAB PM [facility]    Method Requested: Fax to [facility] Initial call taken by: Lorne Skeens,  January 25, 2010 12:52 PM  Follow-up for Phone Call        phone nunber that i have and that was written from this phone note is disconnected..If pt calls back again please let them know meds has been called in and this is the second time Follow-up by: Kem Parkinson,  January 25, 2010 1:24 PM    Prescriptions: AMLODIPINE BESYLATE 10 MG TABS (AMLODIPINE BESYLATE) 1 a day  #90 x 3   Entered by:   Kem Parkinson   Authorized by:   Colon Branch, MD, [facility]   Signed by:   Kem Parkinson on 01/25/2010   Method used:   Faxed to ...       [facility] (mail-order)             , [place]         Ph: [phone]       Fax: [phone]   RxID:   [id number] METOPROLOL TARTRATE 100 MG  TABS (METOPROLOL TARTRATE) 1 TAB AM 1/2 TAB PM  #180 x 3   Entered by:   Kem Parkinson   Authorized by:   Colon Branch, MD, [facility]   Signed by:   Kem Parkinson on 01/25/2010   Method used:   Faxed to ...       [facility] (mail-order)             , [place]         Ph: [phone]       Fax: [phone]   RxID:   [id number] TRIAMTERENE-HCTZ 37.5-25 MG TABS (TRIAMTERENE-HCTZ) 1 by mouth once daily  #90 x 3   Entered by:   Kem Parkinson   Authorized by:   Colon Branch, MD, [facility]   Signed by:   Kem Parkinson on 01/25/2010   Method used:   Faxed to ...       [facility] (mail-[facility])             , [place]         Ph: [phone]       Fax: [phone]   RxID:   [id number] CRESTOR 10 MG TABS (ROSUVASTATIN CALCIUM) 1 by mouth at bedtime  #90 x 3   Entered by:   Kem Parkinson   Authorized by:   Colon Branch, MD, [facility]   Signed by:   Kem Parkinson on 01/25/2010   Method used:   Faxed to ...       [facility] (mail-order)             , [place]         Ph: [phone]       Fax: [phone]   RxID:   [id number]

## 2010-05-15 LAB — POCT I-STAT, CHEM 8
BUN: 23 mg/dL (ref 6–23)
Calcium, Ion: 1.03 mmol/L — ABNORMAL LOW (ref 1.12–1.32)
Chloride: 106 mEq/L (ref 96–112)
Creatinine, Ser: 1 mg/dL (ref 0.4–1.2)
Glucose, Bld: 159 mg/dL — ABNORMAL HIGH (ref 70–99)
HCT: 46 % (ref 36.0–46.0)
Hemoglobin: 15.6 g/dL — ABNORMAL HIGH (ref 12.0–15.0)
Potassium: 3.3 mEq/L — ABNORMAL LOW (ref 3.5–5.1)
Sodium: 143 mEq/L (ref 135–145)
TCO2: 28 mmol/L (ref 0–100)

## 2010-05-15 LAB — URINALYSIS, ROUTINE W REFLEX MICROSCOPIC
Bilirubin Urine: NEGATIVE
Glucose, UA: NEGATIVE mg/dL
Hgb urine dipstick: NEGATIVE
Ketones, ur: NEGATIVE mg/dL
Nitrite: NEGATIVE
Protein, ur: NEGATIVE mg/dL
Specific Gravity, Urine: 1.019 (ref 1.005–1.030)
Urobilinogen, UA: 0.2 mg/dL (ref 0.0–1.0)
pH: 5.5 (ref 5.0–8.0)

## 2010-05-15 LAB — URINE CULTURE: Culture: NO GROWTH

## 2010-05-15 LAB — URINE MICROSCOPIC-ADD ON

## 2010-07-12 NOTE — Assessment & Plan Note (Signed)
[facility]                            CARDIOLOGY OFFICE NOTE   SUSEN, HASKEW                      MRN:          [id number]  DATE:10/14/2007                            DOB:          1955-03-22    Amy Owens returns today for followup.  Her English is not quite so good.  Her son who is a Holiday representative at [employer] is with her today.  I went over all  of her tests with her today.  She has significant hypertension.  She had  a systolic ejection murmur.  Her echocardiogram is essentially normal  with no significant valvular heart disease.  EF was 55-60%.   Left atrium was mildly dilated, and her septal thickness was only 6 mm  which I find a little bit too low, but certainly there was no  significant hypertrophy.   In regards to her hypertension, she seems to have a good response to her  medication; however, I noticed her heart rate is a little bit low in the  low 50s.  We will adjust her Lopressor dose accordingly.  She does not  have any significant headache, palpitations, or chest pain.  Her stress  Myoview study was also normal with a good EF with no ischemia or  infarction.   After going over all of these tests with the patient, I talked to her  about her diet a little bit.  She continues to have a little bit too  much salt in her diet.  This has led to some lower extremity edema,  which appears dependent related to her salt intake.  There is no  previous history of venous disease or trauma.   She is on triamterene as a diuretic.   Review of systems is otherwise negative.   Her current medications include:  1. Crestor 10 a day.  2. Triamterene 37.5.  3. Ambien.  4. Aspirin a day.  5. Relpax p.r.n. for migraines.  6. Clonidine 0.2 a day.  7. Metoprolol 100 in the morning and 50 at night.  The morning dose      will be decreased to 50.  8. Amlodipine 10 a day.   PHYSICAL EXAMINATION:  GENERAL:  Her exam is remarkable for healthy-  appearing  Spanish female in no distress.  VITAL SIGNS:  Weight is 156, blood pressure is 130/77, pulse is 50 and  regular, respiratory rate 14, and afebrile.  HEENT:  Unremarkable.  CAROTIDS:  Normal without bruit.  No lymphadenopathy and no JVP  elevation.  LUNGS:  Clear and good diaphragmatic motion.  No wheezing.  HEART:  S1and S2 with systolic ejection murmur.  PMI normal.  ABDOMEN:  Benign.  Bowel sounds positive.  No AAA, no tenderness, no  bruit, and no hepatosplenomegaly.  No hepatojugular reflux and no  tenderness.  EXTREMITY:  Distal pulses are intact and trace edema.  NEURO:  Nonfocal.  SKIN:  Warm and dry.  MUSCULOSKELETAL:  No muscular weakness.   IMPRESSION:  1. Hypertension, currently well controlled.  Decrease salt intake.      Adjust medications.  2. Relative bradycardia.  I  decreased Lopressor to 50 b.i.d.  3. Lower extremity edema.  Continue triamterene.  Elevate legs at the      end of the day.  4. Hypercholesterolemia.  Continue Crestor.  Lipid and liver profile      in 6 months.   Her EKG today showed sinus bradycardia at a rate of 53.  There was no  significant heart block or PR interval prolongation.     Noralyn Pick. Eden Emms, MD, [facility]  Electronically Signed    PCN/MedQ  DD: 10/14/2007  DT: 10/15/2007  Job #: [id number]

## 2010-07-12 NOTE — Assessment & Plan Note (Signed)
[facility]                            CARDIOLOGY OFFICE NOTE   KALE, DOLS                      MRN:          [id number]  DATE:05/14/2008                            DOB:          [birth year]    [name] returns today for followup.  She has had atypical chest pain  in the past with normal Myoview.  She has had longstanding hypertension.  Her last echo done August 05, 2007, showed overall normal LV function.  Her  septal thickness was only 6-8 mm.   She has been compliant with her medicine.  She has lost about 20 pounds  over the last year.  I congratulated her on this.  She has been taking  Crestor for hyperlipidemia.  She needs to have a follow up lipid and  liver profile which we will schedule.  She has not had any side effects  from it.   Otherwise, she is doing well.  She has not had any recurrent chest pain,  PND, or orthopnea.  There has been no lower extremity edema and no  diaphoresis.   REVIEW OF SYSTEMS:  Otherwise negative.   ALLERGIES:  No known allergies.   MEDICATIONS:  1. Crestor 10 mg a day.  2. Triamterene and hydrochlorothiazide 37.5.  3. Ambien 10 nightly.  4. __________ clonidine 0.2 a day.  5. Metoprolol 50 mg in the morning and 50 at night.  6. Amlodipine 10 a day.   The last time I saw her, she had relative bradycardia and we decreased  her metoprolol.   PHYSICAL EXAMINATION:  GENERAL:  Her exam is remarkable for healthy-  appearing Spanish female in no distress.  Her English is a bit broken.  VITAL SIGNS:  Weight is 150, blood pressure 143/81, pulse 64 and  regular.  Pulse has improved since the last time I saw her in August  2009 when it was 50.  HEENT:  Unremarkable.  Carotids are normal without bruit.  No  lymphadenopathy, thyromegaly, or JVP elevation.  LUNGS:  Clear.  Good diaphragmatic motion.  No wheezing.  CARDIAC:  S1 and S2 with normal heart sounds.  PMI normal.  She has had  a previous systolic  ejection murmur that is not audible at this point.  ABDOMEN:  Benign.  Bowel sounds positive.  No AAA.  No tenderness.  No  bruit.  No hepatosplenomegaly, hepatojugular reflux, or tenderness.  EXTREMITIES:  Distal pulses are intact.  No edema.  NEURO:  Nonfocal.  SKIN:  Warm and dry.  MUSCULOSKELETAL:  No muscular weakness.   EKG is totally normal today without LVH.   IMPRESSION:  1. Previous atypical chest pain, resolved.  Continue baby aspirin.  2. Previous systolic murmur, no longer apparent likely flow murmur.      No significant valvular heart disease on echo last year.  No need      for subacute bacterial endocarditis prophylaxis.  3. Hypertension, currently well controlled.  Continue current dose of      medications and low-sodium diet.  Weight loss is helped.  4. Hyperlipidemia on Crestor.  Lipid and  liver profile needs to be      checked.  Has not been looked out in the year.  We will schedule      this at our office which is more convenient for her.  5. Insomnia with some sleep disturbance.  Continue p.r.n. Ambien which      is prescribed by Dr. Drue Novel.  Overall, I think Jackilyn is doing well.      I will see her back in a year's time.     Noralyn Pick. Eden Emms, MD, [facility]  Electronically Signed    PCN/MedQ  DD: 05/14/2008  DT: 05/15/2008  Job #: [id number]

## 2010-07-12 NOTE — Assessment & Plan Note (Signed)
[facility]                            CARDIOLOGY OFFICE NOTE   DLYNN, RANES                      MRN:          [id number]  DATE:07/25/2007                            DOB:          Oct 16, 1955    The patient is a 55 year old [place] immigrant referred for multiple  cardiac risk factors, murmur, hypertension, chest pain.   The patient has had hypertension for over 10 years.  It has been  somewhat more refractory.  She does take her blood pressure at home.  She continues to add salt to her cooking.  She does not drink or smoke.  She is fairly sedentary.   She has been compliant with her medications.   She has not had a workup for secondary causes of hypertension, and as  far as I know her kidney function is normal.   The patient has had some atypical chest pain.  It is in the center of  chest.  It is fleeting.  It has been going on for about 2 months.  There  is no radiation.  No associated diaphoresis or shortness of breath.  The  pain is not progressive but persistent.  She has not had a previous  stress test.   In regards to her diet, she does eat somewhat ethnic food.  She seems to  continue to add salt, and her carbohydrate load has recently been  decreased to a concern about sugar.   PAST MEDICAL HISTORY:  1. Previous knee surgery.  2. C-section.  3. She has hypertension.  4. Hypercholesterolemia.  5. She may be a new-onset diabetic, probably type 2.   The patient is a housewife.  She has been here from [place] for about 10  years.  She speaks very little [place], but her son is an Equities trader.  She has three children.  They seem to be doing well.  In fact, they live  around the corner from me on [address].  She is very sedentary and  is trying to follow up better diet in regards to her sugar.   She is happily married and has three kids.  In fact, the son who is with  her is taking his MCAT soon.   FAMILY HISTORY:  Positive  for coronary disease in a sister and brother.   She is currently taking:  1. Crestor 10 mg a day.  2. Triamterene/hydrochlorothiazide 37.5.  3. Clonidine 0.1 mg, to be increased to 0.1 b.i.d.  4. Metoprolol 100 mg, to be increased to 100 in the morning and 50 at      night.  5. Ambien 10 at bedtime.  6. Amlodipine, to be increased from 5 to 10.  7. An aspirin a day.  8. Relpax for migraines.   PHYSICAL EXAMINATION:  GENERAL:  Remarkable for an overweight [place]  female, in no distress.  She speaks a little bit of [place].  She seems  to be in a good mood.  VITAL SIGNS:  Weight is 170, blood pressure is 170/90, pulse 93,  afebrile, respiratory rate 14.  HEENT:  Unremarkable.  NECK:  Carotids  normal, without bruit, no lymphadenopathy, thyromegaly,  or JVP elevation.  LUNGS:  Clear.  Good diaphragmatic motion.  No wheezing.  HEART:  S1, S2, with a systolic ejection murmur.  PMI normal.  ABDOMEN:  Benign.  Bowel sounds positive.  No bruit, no  hepatosplenomegaly, or hepatojugular reflux.  No tenderness.  EXTREMITIES:  Distal pulse intact.  No edema.  NEUROLOGIC:  Nonfocal.  SKIN:  Warm and dry.  No muscular weakness.   IMPRESSION:  1. Hypertension.  Medications to be adjusted.  Check renal duplex to      rule out renal artery stenosis.  Continue low-salt diet.  Follow      home blood pressure readings.  Follow up in 8-10 weeks.  2. Hypercholesterolemia.  Continue Crestor.  Lipid and liver profile      in 6 months.  Low-cholesterol diet.  3. Systolic murmur.  Follow up echocardiogram.  May be due to      hypertension and LVH.  4. Borderline diabetes.  Check hemoglobin A1c.  Continue low-carb      diet.  Follow up with Dr. Drue Novel.  5. Atypical chest pain.  Multiple coronary risk factors.  Follow-up      stress Myoview.   I will see her back after all of these tests to go over them with her  and further follow her blood pressure.     Noralyn Pick. Eden Emms, MD, [facility]  Electronically  Signed    PCN/MedQ  DD: 07/25/2007  DT: 07/25/2007  Job #: [id number]   cc:   Willow Ora, MD

## 2010-07-15 NOTE — Op Note (Signed)
NAMEAPRYL, Amy Owens             ACCOUNT NO.:  [id number]   MEDICAL RECORD NO.:  [id number]          PATIENT TYPE:  AMB   LOCATION:  ENDO                         FACILITY:  [facility]   PHYSICIAN:  Anselmo Rod, M.D.  DATE OF BIRTH:  September 20, 1955   DATE OF PROCEDURE:  09/02/2004  DATE OF DISCHARGE:                                 OPERATIVE REPORT   PROCEDURE PERFORMED:  Colonoscopy with snare polypectomy x 1 and cold biopsy  x 1.   ENDOSCOPIST:  Anselmo Rod, M.D.   INSTRUMENT USED:  Olympus video colonoscope.   INDICATIONS FOR PROCEDURE:  A 55 year old [name] undergoing screening  colonoscopy.  The patient has a history of chronic constipation.  To rule  out colonic polyps, masses, etc.   PRE-PROCEDURE PREPARATION:  Informed consent was procured from the patient.  The patient was fasted for eight hours prior to the procedure and prepped  with a bottle of magnesium citrate and a gallon of GoLYTELY the night prior  to the procedure.  The risks and benefits of the procedure including a 10%  miss rate of cancer and polyps were discussed with her as well.   PRE-PROCEDURE PHYSICAL:  VITAL SIGNS:  The patient had stable vital signs.  NECK:  Supple.  CHEST:  Clear to auscultation.  CARDIOVASCULAR:  S1, S2 regular.  ABDOMEN:  Soft, with normal bowel sounds.   DESCRIPTION OF THE PROCEDURE:  The patient was placed in the left lateral  decubitus position, sedated with 100 mg of Demerol and 10 mg of Versed in  slow incremental doses.  Once the patient was adequately sedated and  maintained on low-flow oxygen and continuous cardiac monitoring, the Olympus  video colonoscope was advanced from the rectum to the cecum.  The  appendiceal orifice and the ileocecal valve were visualized and  photographed.  There was a large amount of residual stool in the colon.  Multiple washings were done.  A small sessile polyp was snared from the  proximal right colon close to the IC valve.  Another  very small sessile  polyp was biopsied (cold biopsies x 2) from the cecal base.  The rest of the  exam was unremarkable.  Multiple washings were done.  The patient had small  internal hemorrhoids seen on retroflexion.  She tolerated the procedure  well, without immediate complications.   IMPRESSION:  1.Small nonbleeding internal hemorrhoids.  2.Two polyps removed from the proximal right colon, one by snare polypectomy  and one by cold biopsy.  3.No evidence of diverticulosis.  4.Large amount of residual stool in the colon.  Small lesions could be  missed.   RECOMMENDATIONS:  1.Await pathology results.  2.Repeat colonoscopy depending on pathology results.  3.Avoid nonsteroidals including aspirin for the next two weeks.  4.Continue a high-fiber diet with liberal fluid intake and stool softeners,  100-200 mg at bedtime as needed.  5.Outpatient followup as the need arises in the future.       JNM/MEDQ  D:  09/02/2004  T:  09/03/2004  Job:  [id number]   cc:   Maxie Better, M.D.  [address]  [address]  [place]  [place] [address]  Fax: [phone]   Wanda Plump, MD [facility]  [phone] [address] [place], [place] [address]

## 2010-08-04 ENCOUNTER — Encounter: Payer: Self-pay | Admitting: Cardiology

## 2010-09-12 ENCOUNTER — Ambulatory Visit (INDEPENDENT_AMBULATORY_CARE_PROVIDER_SITE_OTHER): Payer: 59 | Admitting: Cardiovascular Disease

## 2010-09-12 ENCOUNTER — Encounter: Payer: Self-pay | Admitting: Cardiovascular Disease

## 2010-09-12 VITALS — BP 159/92 | HR 87 | Resp 18 | Ht 59.0 in | Wt 158.0 lb

## 2010-09-12 DIAGNOSIS — Z9189 Other specified personal risk factors, not elsewhere classified: Secondary | ICD-10-CM

## 2010-09-12 DIAGNOSIS — E7800 Pure hypercholesterolemia, unspecified: Secondary | ICD-10-CM

## 2010-09-12 DIAGNOSIS — I10 Essential (primary) hypertension: Secondary | ICD-10-CM

## 2010-09-12 MED ORDER — CLONIDINE HCL 0.2 MG PO TABS: 0.2000 mg | ORAL_TABLET | Freq: Every day | ORAL | Status: DC

## 2010-09-12 MED ORDER — LOSARTAN POTASSIUM-HCTZ 50-12.5 MG PO TABS: 1.0000 | ORAL_TABLET | Freq: Every day | ORAL | Status: DC

## 2010-09-12 NOTE — Assessment & Plan Note (Signed)
Resolved with no evidence of CAD

## 2010-09-12 NOTE — Assessment & Plan Note (Signed)
She is sleepy with clonidine.  Decrease to 1/day in evening.  Start Hyzaar 50/12.5 and stop other diuretic.  F/U 4-6 weeks

## 2010-09-12 NOTE — Progress Notes (Signed)
[name] returns today for followup. She has had atypical chest pain in the past with normal Myoview. She has had longstanding hypertension. Her last echo done August 05, 2007, showed overall normal LV function. Her septal thickness was only 6-8 mm. Her cholesterol is well Rx with crestor. She is not having any recurrent chest pain.  Has been in [place] the last year and is returning to have their 57 yo daughter go to college here.  BP is suboptimally controlled.    ROS: Denies fever, malais, weight loss, blurry vision, decreased visual acuity, cough, sputum, SOB, hemoptysis, pleuritic pain, palpitaitons, heartburn, abdominal pain, melena, lower extremity edema, claudication, or rash.  All other systems reviewed and negative  General: Affect appropriate Healthy:  appears stated age HEENT: normal Neck supple with no adenopathy JVP normal no bruits no thyromegaly Lungs clear with no wheezing and good diaphragmatic motion Heart:  S1/S2 no murmur,rub, gallop or click PMI normal Abdomen: benighn, BS positve, no tenderness, no AAA no bruit.  No HSM or HJR Distal pulses intact with no bruits No edema Neuro non-focal Skin warm and dry No muscular weakness   Current Outpatient Prescriptions  Medication Sig Dispense Refill  . amLODipine (NORVASC) 10 MG tablet Take 10 mg by mouth daily.        Marland Kitchen aspirin (BABY ASPIRIN) 81 MG chewable tablet Chew 81 mg by mouth daily.        . cloNIDine (CATAPRES) 0.2 MG tablet Take 0.2 mg by mouth 2 (two) times daily.        . metoprolol (TOPROL-XL) 100 MG 24 hr tablet Take 100 mg by mouth. 1 tab am, 1/2 tab pm       . rosuvastatin (CRESTOR) 10 MG tablet Take 10 mg by mouth daily.        Marland Kitchen triamterene-hydrochlorothiazide (DYAZIDE) 37.5-25 MG per capsule Take 1 capsule by mouth daily.          Allergies  Review of patient's allergies indicates no known allergies.  Electrocardiogram: NSR 73 LAE nonspecfic ST/T wave changes  Assessment and Plan

## 2010-09-12 NOTE — Patient Instructions (Addendum)
Your physician recommends that you schedule a follow-up appointment in: 4-6 WEEKS WITH DR [facility] Your physician has recommended you make the following change in your medication: HYZAAR 50/12.5 MG 1 DAILY  DECREASE  CLONIDINE TO 1 EVERY EVENING  You have been referred to  DR PAZ  10-10-10  AT 1:30 PM

## 2010-09-12 NOTE — Assessment & Plan Note (Signed)
Cholesterol is at goal.  Continue current dose of statin and diet Rx.  No myalgias or side effects.  F/U  LFT's in 6 months. Lab Results  Component Value Date   LDLCALC 123* 11/16/2008

## 2010-10-10 ENCOUNTER — Ambulatory Visit (INDEPENDENT_AMBULATORY_CARE_PROVIDER_SITE_OTHER): Payer: 59 | Admitting: Internal Medicine

## 2010-10-10 ENCOUNTER — Encounter: Payer: Self-pay | Admitting: Internal Medicine

## 2010-10-10 DIAGNOSIS — E785 Hyperlipidemia, unspecified: Secondary | ICD-10-CM

## 2010-10-10 DIAGNOSIS — F519 Sleep disorder not due to a substance or known physiological condition, unspecified: Secondary | ICD-10-CM

## 2010-10-10 DIAGNOSIS — I10 Essential (primary) hypertension: Secondary | ICD-10-CM

## 2010-10-10 DIAGNOSIS — R7309 Other abnormal glucose: Secondary | ICD-10-CM

## 2010-10-10 MED ORDER — ZOLPIDEM TARTRATE 10 MG PO TABS: 10.0000 mg | ORAL_TABLET | Freq: Every evening | ORAL | Status: DC | PRN

## 2010-10-10 MED ORDER — CLONIDINE HCL 0.2 MG PO TABS: 0.2000 mg | ORAL_TABLET | Freq: Two times a day (BID) | ORAL | Status: DC

## 2010-10-10 NOTE — Progress Notes (Signed)
  Subjective:    Patient ID: Amy Owens, female    DOB: Jul 10, 1955, 55 y.o.   MRN: [id number]  HPI ROV, does not need a CPX HTN--saw cardiology recently, medications changes, see assessment and plan DM-- on diet only, no CBGs Hyperlipidemia -- on crestor , good compliance   Past Medical History  Diagnosis Date  . Chest pain     atypical, neg stress test 2009  . DM (diabetes mellitus)     borderline   . Migraine headache   . Hypertension   . Hyperlipidemia   . Perimenopausal   . Anxiety   . Insomnia     sleep disorder unspec    Past Surgical History  Procedure Date  . Colonoscopy w/ polypectomy 2006  . Knee arthroscopy 2007  . Tubal ligation   . Cesarean section      Social History: Married original from [place] 2 sons and 1 daughter Never Smoked Alcohol use-no Drug use-no Regular exercise- none    Review of Systems No chest pain or shortness or breath No nausea, vomiting, diarrhea She was living out of state, her doctor over there told her that her potassium was low, needs to be rechecked. She was also having leg pain, ultrasound was done an apparently the "venous circulation was okay"  (while she in [place])    Objective:   Physical Exam  Constitutional: She is oriented to person, place, and time. She appears well-developed and well-nourished. No distress.  HENT:  Head: Normocephalic and atraumatic.  Cardiovascular: Normal rate, regular rhythm and normal heart sounds.   No murmur heard. Pulmonary/Chest: Effort normal and breath sounds normal. No respiratory distress. She has no wheezes. She has no rales.  Musculoskeletal: She exhibits no edema.  Neurological: She is alert and oriented to person, place, and time.  Skin: She is not diaphoretic.  Psychiatric: She has a normal mood and affect. Her behavior is normal. Judgment and thought content normal.          Assessment & Plan:

## 2010-10-10 NOTE — Patient Instructions (Addendum)
Came back in 2 or 3 weeks fasting: FLP---dx hyperlipidemia A1C---dx DM BMP CBC-- dx HTN Check the  blood pressure 2 or 3 times a week, be sure it is less than 140/85. If it is consistently higher, let me know

## 2010-10-10 NOTE — Assessment & Plan Note (Signed)
Labs

## 2010-10-10 NOTE — Assessment & Plan Note (Signed)
Due for labs, see instructions  

## 2010-10-10 NOTE — Assessment & Plan Note (Signed)
RF ambien 

## 2010-10-10 NOTE — Assessment & Plan Note (Addendum)
Reports low K while living out of state, more recently was found to have  elavated BP @ [facility]:  clonodine was decreased to 1 a day d/t somnolence Started hyzaar Was told to d/c diuretic but she didn't Plan: d/c diuretics, labs in 2 weeks , check amb BPs

## 2010-10-24 ENCOUNTER — Ambulatory Visit: Payer: 59 | Admitting: Cardiovascular Disease

## 2010-10-28 ENCOUNTER — Other Ambulatory Visit: Payer: Self-pay | Admitting: Internal Medicine

## 2010-10-28 DIAGNOSIS — E785 Hyperlipidemia, unspecified: Secondary | ICD-10-CM

## 2010-10-28 DIAGNOSIS — I10 Essential (primary) hypertension: Secondary | ICD-10-CM

## 2010-11-01 ENCOUNTER — Other Ambulatory Visit (INDEPENDENT_AMBULATORY_CARE_PROVIDER_SITE_OTHER): Payer: 59

## 2010-11-01 DIAGNOSIS — E785 Hyperlipidemia, unspecified: Secondary | ICD-10-CM

## 2010-11-01 DIAGNOSIS — I10 Essential (primary) hypertension: Secondary | ICD-10-CM

## 2010-11-01 DIAGNOSIS — E119 Type 2 diabetes mellitus without complications: Secondary | ICD-10-CM

## 2010-11-01 LAB — LIPID PANEL
Cholesterol: 126 mg/dL (ref 0–200)
Total CHOL/HDL Ratio: 3
Triglycerides: 90 mg/dL (ref 0.0–149.0)

## 2010-11-01 LAB — CBC WITH DIFFERENTIAL/PLATELET
Basophils Relative: 1 % (ref 0.0–3.0)
Eosinophils Absolute: 0.3 10*3/uL (ref 0.0–0.7)
Eosinophils Relative: 5 % (ref 0.0–5.0)
Hemoglobin: 13.5 g/dL (ref 12.0–15.0)
Lymphocytes Relative: 37.1 % (ref 12.0–46.0)
MCHC: 33.7 g/dL (ref 30.0–36.0)
Monocytes Relative: 7.9 % (ref 3.0–12.0)
Neutro Abs: 3.4 10*3/uL (ref 1.4–7.7)
RBC: 4.29 Mil/uL (ref 3.87–5.11)
WBC: 6.9 10*3/uL (ref 4.5–10.5)

## 2010-11-01 NOTE — Progress Notes (Signed)
Labs only

## 2010-11-02 ENCOUNTER — Telehealth: Payer: Self-pay | Admitting: *Deleted

## 2010-11-02 DIAGNOSIS — I10 Essential (primary) hypertension: Secondary | ICD-10-CM

## 2010-11-02 NOTE — Telephone Encounter (Signed)
LMOM for Pt call back to have lab only appt for: BMP

## 2010-12-13 ENCOUNTER — Encounter: Payer: Self-pay | Admitting: Internal Medicine

## 2010-12-13 ENCOUNTER — Ambulatory Visit (INDEPENDENT_AMBULATORY_CARE_PROVIDER_SITE_OTHER): Payer: 59 | Admitting: Internal Medicine

## 2010-12-13 VITALS — BP 118/78 | HR 58 | Temp 98.5°F | Resp 14 | Wt 160.1 lb

## 2010-12-13 DIAGNOSIS — Z23 Encounter for immunization: Secondary | ICD-10-CM

## 2010-12-13 DIAGNOSIS — I10 Essential (primary) hypertension: Secondary | ICD-10-CM

## 2010-12-13 MED ORDER — TRIAMTERENE-HCTZ 37.5-25 MG PO CAPS: 1.0000 | ORAL_CAPSULE | ORAL | Status: DC

## 2010-12-13 MED ORDER — AMLODIPINE BESYLATE 10 MG PO TABS: 10.0000 mg | ORAL_TABLET | Freq: Every day | ORAL | Status: DC

## 2010-12-13 MED ORDER — ROSUVASTATIN CALCIUM 10 MG PO TABS: 10.0000 mg | ORAL_TABLET | Freq: Every day | ORAL | Status: DC

## 2010-12-13 NOTE — Progress Notes (Signed)
  Subjective:    Patient ID: Amy Owens, female    DOB: [birth year], 55 y.o.   MRN: [id number]  HPI ROV, feeling well   Past Medical History  Diagnosis Date  . Chest pain     atypical, neg stress test 2009  . DM (diabetes mellitus)     borderline   . Migraine headache   . Hypertension   . Hyperlipidemia   . Perimenopausal   . Anxiety   . Insomnia     sleep disorder unspec   Past Surgical History  Procedure Date  . Colonoscopy w/ polypectomy 2006  . Knee arthroscopy 2007  . Tubal ligation   . Cesarean section      Review of Systems Developed blurred visio, dizzines, back pain shortly after the last OV, eventually d/c hyzaar a week ago as she felt sx were s/e from it, went back to a diuretic BP was well controlled while on hyzaar and in the last few days     Objective:   Physical Exam  Constitutional: She is oriented to person, place, and time. She appears well-developed and well-nourished.  HENT:  Head: Normocephalic and atraumatic.  Cardiovascular: Normal rate, regular rhythm and normal heart sounds.   No murmur heard. Pulmonary/Chest: Effort normal and breath sounds normal. No respiratory distress. She has no wheezes. She has no rales.  Musculoskeletal: She exhibits no edema.  Neurological: She is alert and oriented to person, place, and time.  Psychiatric: She has a normal mood and affect. Her behavior is normal. Judgment and thought content normal.          Assessment & Plan:

## 2010-12-13 NOTE — Assessment & Plan Note (Addendum)
Since the last OV developed blurred vision, dizziness, back ache, eventually a week ago d/c hyzaar thinking it was a s/e and feels better. Went back to diuretics  BP remains well controlled at home  Plan: No change as long as BP remain wnl, see instructions . (unclear if all sx were s/e from losartan, if needed we could re try another ARB in the future)

## 2010-12-13 NOTE — Patient Instructions (Signed)
Check the  blood pressure 4 to 5 times  a week, be sure it is less than 140/85. If it is consistently higher, let me know

## 2010-12-14 LAB — BASIC METABOLIC PANEL
BUN: 23 mg/dL (ref 6–23)
CO2: 29 mEq/L (ref 19–32)
Chloride: 104 mEq/L (ref 96–112)
Creatinine, Ser: 0.8 mg/dL (ref 0.4–1.2)
Glucose, Bld: 93 mg/dL (ref 70–99)

## 2011-03-02 ENCOUNTER — Other Ambulatory Visit: Payer: Self-pay | Admitting: Cardiovascular Disease

## 2011-03-09 ENCOUNTER — Encounter (HOSPITAL_BASED_OUTPATIENT_CLINIC_OR_DEPARTMENT_OTHER): Payer: Self-pay | Admitting: *Deleted

## 2011-03-09 NOTE — Progress Notes (Signed)
Talked with husband-pt speeks spanish, understands english-he said she does not need an interpretor Need [facility] crutches

## 2011-03-10 ENCOUNTER — Encounter: Payer: Self-pay | Admitting: Physician Assistant

## 2011-03-10 ENCOUNTER — Other Ambulatory Visit: Payer: Self-pay | Admitting: Physician Assistant

## 2011-03-10 DIAGNOSIS — S83249A Other tear of medial meniscus, current injury, unspecified knee, initial encounter: Secondary | ICD-10-CM | POA: Insufficient documentation

## 2011-03-10 NOTE — H&P (Signed)
Amy Owens is an 55 y.o. female.   Chief Complaint: LEFT KNEE PAIN HPI: Amy Owens is a 55 year old seen with Dr. Kramer for significant left knee pain increasing in nature. This has been bothering her for 1-2 months. Pain with twisting and turning relieved by rest. This is getting progressively worse. She had a MRI of the left knee on 03/02/11 that revealed a medial meniscus tear with a large joint effusion and a leaking Baker's cyst as well as grade IV superior apical patellofemoral chondromalacia. She's having significant difficulty walking and bending secondary to pain. She's taking antiinflammatories with minimal relief. She had aspirations and injections with temporary relief.  Past Medical History  Diagnosis Date  . Chest pain     atypical, neg stress test 2009  . DM (diabetes mellitus)     borderline   . Migraine headache   . Hypertension   . Hyperlipidemia   . Perimenopausal   . Anxiety   . Insomnia     sleep disorder unspec    Past Surgical History  Procedure Date  . Colonoscopy w/ polypectomy 2006  . Knee arthroscopy 2007  . Tubal ligation   . Cesarean section     Family History  Problem Relation Age of Onset  . Coronary artery disease Mother     50s  . Diabetes Mother   . Coronary artery disease Father     40s  . Diabetes Sister   . Diabetes Brother   . Diabetes Brother    Social History:  reports that she has never smoked. She does not have any smokeless tobacco history on file. She reports that she does not drink alcohol or use illicit drugs.  Allergies: No Known Allergies  Medications Prior to Admission  Medication Sig Dispense Refill  . amLODipine (NORVASC) 10 MG tablet Take 1 tablet (10 mg total) by mouth daily.  90 tablet  2  . aspirin (BABY ASPIRIN) 81 MG chewable tablet Chew 81 mg by mouth daily.        . cloNIDine (CATAPRES) 0.2 MG tablet Take 0.2 mg by mouth daily.        . HYDROcodone-acetaminophen (NORCO) 5-325 MG per tablet Take 1  tablet by mouth every 6 (six) hours as needed.      . metoprolol (LOPRESSOR) 100 MG tablet TAKE 1 TABLET IN THE MORNING AND ONE-HALF (1/2) TABLET IN THE EVENING  180 tablet  2  . metoprolol (TOPROL-XL) 100 MG 24 hr tablet Take 100 mg by mouth. 1 tab am, 1/2 tab pm       . rosuvastatin (CRESTOR) 10 MG tablet Take 1 tablet (10 mg total) by mouth daily.  90 tablet  2  . triamterene-hydrochlorothiazide (DYAZIDE) 37.5-25 MG per capsule Take 1 each (1 capsule total) by mouth every morning.  90 capsule  2  . zolpidem (AMBIEN) 10 MG tablet Take 10 mg by mouth at bedtime as needed.      . DISCONTD: cloNIDine (CATAPRES) 0.2 MG tablet Take 1 tablet (0.2 mg total) by mouth 2 (two) times daily.  90 tablet  3   No current facility-administered medications on file as of 03/10/2011.    No results found for this or any previous visit (from the past 48 hour(s)). No results found.  Review of Systems  Constitutional: Negative.   HENT: Negative.   Eyes: Negative.   Respiratory: Negative.   Cardiovascular: Negative.   Gastrointestinal: Negative.   Genitourinary: Negative.   Musculoskeletal: Positive for joint pain.         LEFT KNEE  Skin: Negative.   Neurological: Negative.   Endo/Heme/Allergies: Negative.   Psychiatric/Behavioral: Negative.     There were no vitals taken for this visit. Physical Exam  Constitutional: She is oriented to person, place, and time. She appears well-developed and well-nourished.  HENT:  Head: Normocephalic and atraumatic.  Mouth/Throat: Oropharynx is clear and moist.  Eyes: Conjunctivae and EOM are normal. Pupils are equal, round, and reactive to light.  Neck: Neck supple.  Cardiovascular: Normal heart sounds.   Respiratory: Effort normal.  GI: Soft.  Genitourinary:       Not pertinent to current symptomatology therefore not examined.  Musculoskeletal:       Examination of her left knee reveals 1 to 2+ effusion pain medially, range of motion 0-120 degrees positive  medial McMurray's knee is stable to ligamentous exam with normal patella tracking. Exam of the right knee reveals full range of motion without pain swelling weakness or instability. Vascular exam: pulses 2+ and symmetric.   Neurological: She is alert and oriented to person, place, and time.  Skin: Skin is warm and dry.  Psychiatric: She has a normal mood and affect. Her behavior is normal. Judgment and thought content normal.     Assessment Patient Active Problem List  Diagnoses  . HYPERLIPIDEMIA  . ANXIETY  . INSOMNIA-SLEEP DISORDER-UNSPEC  . MIGRAINE HEADACHE  . HYPERTENSION  . BRADYCARDIA  . PERIMENOPAUSAL STATUS  . EDEMA  . SYSTOLIC MURMUR  . DIABETES MELLITUS, BORDERLINE  . MEDIAL MENISCUS TEAR LEFT KNEE    Plan I talk to her and her husband about this in detail. I recommend we proceed with left knee arthroscopy due to failed conservative care. Discussed risks benefits and possible complications of the surgery in detail and they understand this completely.   Evanee Lubrano J 03/10/2011, 1:34 PM    

## 2011-03-13 ENCOUNTER — Encounter (HOSPITAL_BASED_OUTPATIENT_CLINIC_OR_DEPARTMENT_OTHER)
Admission: RE | Disposition: A | Discharge status: Home / Self Care | Payer: Self-pay | Source: Ambulatory Visit | Attending: Orthopedic Surgery

## 2011-03-13 ENCOUNTER — Encounter (HOSPITAL_BASED_OUTPATIENT_CLINIC_OR_DEPARTMENT_OTHER): Payer: Self-pay | Admitting: Anesthesiology

## 2011-03-13 ENCOUNTER — Ambulatory Visit (HOSPITAL_BASED_OUTPATIENT_CLINIC_OR_DEPARTMENT_OTHER): Payer: 59 | Admitting: Anesthesiology

## 2011-03-13 ENCOUNTER — Ambulatory Visit (HOSPITAL_BASED_OUTPATIENT_CLINIC_OR_DEPARTMENT_OTHER)
Admission: RE | Admit: 2011-03-13 | Discharge: 2011-03-13 | Disposition: A | Discharge status: Home / Self Care | Payer: 59 | Source: Ambulatory Visit | Attending: Orthopedic Surgery | Admitting: Orthopedic Surgery

## 2011-03-13 ENCOUNTER — Encounter (HOSPITAL_BASED_OUTPATIENT_CLINIC_OR_DEPARTMENT_OTHER): Payer: Self-pay

## 2011-03-13 DIAGNOSIS — E119 Type 2 diabetes mellitus without complications: Secondary | ICD-10-CM | POA: Insufficient documentation

## 2011-03-13 DIAGNOSIS — M23349 Other meniscus derangements, anterior horn of lateral meniscus, unspecified knee: Secondary | ICD-10-CM | POA: Insufficient documentation

## 2011-03-13 DIAGNOSIS — R51 Headache: Secondary | ICD-10-CM | POA: Insufficient documentation

## 2011-03-13 DIAGNOSIS — S83249A Other tear of medial meniscus, current injury, unspecified knee, initial encounter: Secondary | ICD-10-CM

## 2011-03-13 DIAGNOSIS — M23329 Other meniscus derangements, posterior horn of medial meniscus, unspecified knee: Secondary | ICD-10-CM | POA: Insufficient documentation

## 2011-03-13 DIAGNOSIS — I10 Essential (primary) hypertension: Secondary | ICD-10-CM | POA: Insufficient documentation

## 2011-03-13 DIAGNOSIS — M2240 Chondromalacia patellae, unspecified knee: Secondary | ICD-10-CM | POA: Insufficient documentation

## 2011-03-13 DIAGNOSIS — E785 Hyperlipidemia, unspecified: Secondary | ICD-10-CM | POA: Insufficient documentation

## 2011-03-13 LAB — POCT I-STAT, CHEM 8
Calcium, Ion: 1.16 mmol/L (ref 1.12–1.32)
Chloride: 104 mEq/L (ref 96–112)
Glucose, Bld: 115 mg/dL — ABNORMAL HIGH (ref 70–99)
HCT: 45 % (ref 36.0–46.0)

## 2011-03-13 SURGERY — ARTHROSCOPY, KNEE, WITH MEDIAL MENISCECTOMY
Anesthesia: Choice | Site: Knee | Laterality: Left

## 2011-03-13 MED ORDER — OXYCODONE-ACETAMINOPHEN 5-325 MG PO TABS: ORAL_TABLET | ORAL | Status: DC

## 2011-03-13 MED ORDER — BUPIVACAINE-EPINEPHRINE PF 0.5-1:200000 % IJ SOLN
INTRAMUSCULAR | Status: DC | PRN
  Administered 2011-03-13: 30 mL

## 2011-03-13 MED ORDER — MORPHINE SULFATE 10 MG/ML IJ SOLN
INTRAMUSCULAR | Status: DC | PRN
  Administered 2011-03-13: 4 mg via INTRAVENOUS
  Administered 2011-03-13: 2 mg via INTRAVENOUS

## 2011-03-13 MED ORDER — MIDAZOLAM HCL 5 MG/5ML IJ SOLN
INTRAMUSCULAR | Status: DC | PRN
  Administered 2011-03-13: 1 mg via INTRAVENOUS

## 2011-03-13 MED ORDER — POVIDONE-IODINE 7.5 % EX SOLN: Freq: Once | CUTANEOUS | Status: DC

## 2011-03-13 MED ORDER — LACTATED RINGERS IV SOLN
INTRAVENOUS | Status: DC
  Administered 2011-03-13: 09:00:00 via INTRAVENOUS

## 2011-03-13 MED ORDER — LIDOCAINE HCL (CARDIAC) 20 MG/ML IV SOLN
INTRAVENOUS | Status: DC | PRN
  Administered 2011-03-13: 60 mg via INTRAVENOUS

## 2011-03-13 MED ORDER — CHLORHEXIDINE GLUCONATE 4 % EX LIQD: 60.0000 mL | Freq: Once | CUTANEOUS | Status: DC

## 2011-03-13 MED ORDER — SODIUM CHLORIDE 0.9 % IR SOLN
Status: DC | PRN
  Administered 2011-03-13: 6000 mL

## 2011-03-13 MED ORDER — DEXAMETHASONE SODIUM PHOSPHATE 4 MG/ML IJ SOLN
INTRAMUSCULAR | Status: DC | PRN
  Administered 2011-03-13: 10 mg via INTRAVENOUS

## 2011-03-13 MED ORDER — EPINEPHRINE HCL 1 MG/ML IJ SOLN
INTRAMUSCULAR | Status: DC | PRN
  Administered 2011-03-13: 2 mL

## 2011-03-13 MED ORDER — PROPOFOL 10 MG/ML IV EMUL
INTRAVENOUS | Status: DC | PRN
  Administered 2011-03-13: 200 mg via INTRAVENOUS

## 2011-03-13 MED ORDER — MEPERIDINE HCL 25 MG/ML IJ SOLN: 6.2500 mg | INTRAMUSCULAR | Status: DC | PRN

## 2011-03-13 MED ORDER — FENTANYL CITRATE 0.05 MG/ML IJ SOLN
100.0000 ug | INTRAMUSCULAR | Status: DC | PRN
  Administered 2011-03-13: 50 ug via INTRAVENOUS

## 2011-03-13 MED ORDER — CEFAZOLIN SODIUM 1-5 GM-% IV SOLN
1.0000 g | INTRAVENOUS | Status: AC
  Administered 2011-03-13: 1 g via INTRAVENOUS

## 2011-03-13 MED ORDER — PROMETHAZINE HCL 25 MG/ML IJ SOLN: 6.2500 mg | INTRAMUSCULAR | Status: DC | PRN

## 2011-03-13 MED ORDER — EPHEDRINE SULFATE 50 MG/ML IJ SOLN
INTRAMUSCULAR | Status: DC | PRN
  Administered 2011-03-13: 10 mg via INTRAVENOUS

## 2011-03-13 MED ORDER — ONDANSETRON HCL 4 MG/2ML IJ SOLN
INTRAMUSCULAR | Status: DC | PRN
  Administered 2011-03-13: 4 mg via INTRAVENOUS

## 2011-03-13 MED ORDER — MIDAZOLAM HCL 2 MG/2ML IJ SOLN
2.0000 mg | INTRAMUSCULAR | Status: DC | PRN
  Administered 2011-03-13: 2 mg via INTRAVENOUS

## 2011-03-13 MED ORDER — FENTANYL CITRATE 0.05 MG/ML IJ SOLN
25.0000 ug | INTRAMUSCULAR | Status: DC | PRN
  Administered 2011-03-13: 50 ug via INTRAVENOUS

## 2011-03-13 SURGICAL SUPPLY — 40 items
BANDAGE ELASTIC 6 VELCRO ST LF (GAUZE/BANDAGES/DRESSINGS) ×2 IMPLANT
BLADE CUTTER GATOR 3.5 (BLADE) ×2 IMPLANT
BLADE GREAT WHITE 4.2 (BLADE) ×2 IMPLANT
BLADE SURG 15 STRL LF DISP TIS (BLADE) ×1 IMPLANT
BLADE SURG 15 STRL SS (BLADE) ×1
BNDG COHESIVE 4X5 TAN STRL (GAUZE/BANDAGES/DRESSINGS) ×2 IMPLANT
CANISTER OMNI JUG 16 LITER (MISCELLANEOUS) ×2 IMPLANT
CANISTER SUCTION 2500CC (MISCELLANEOUS) IMPLANT
DRAPE ARTHROSCOPY W/POUCH 90 (DRAPES) ×2 IMPLANT
DURAPREP 26ML APPLICATOR (WOUND CARE) ×2 IMPLANT
GAUZE XEROFORM 1X8 LF (GAUZE/BANDAGES/DRESSINGS) ×2 IMPLANT
GLOVE BIO SURGEON STRL SZ7 (GLOVE) ×2 IMPLANT
GLOVE BIOGEL PI IND STRL 7.0 (GLOVE) ×1 IMPLANT
GLOVE BIOGEL PI IND STRL 7.5 (GLOVE) ×1 IMPLANT
GLOVE BIOGEL PI INDICATOR 7.0 (GLOVE) ×1
GLOVE BIOGEL PI INDICATOR 7.5 (GLOVE) ×1
GLOVE SS BIOGEL STRL SZ 7.5 (GLOVE) ×1 IMPLANT
GLOVE SUPERSENSE BIOGEL SZ 7.5 (GLOVE) ×1
GOWN PREVENTION PLUS XLARGE (GOWN DISPOSABLE) ×2 IMPLANT
HOLDER KNEE FOAM BLUE (MISCELLANEOUS) IMPLANT
KNEE WRAP E Z 3 GEL PACK (MISCELLANEOUS) ×2 IMPLANT
NDL SAFETY ECLIPSE 18X1.5 (NEEDLE) ×2 IMPLANT
NEEDLE HYPO 18GX1.5 SHARP (NEEDLE) ×2
NEEDLE HYPO 22GX1.5 SAFETY (NEEDLE) ×2 IMPLANT
PACK ARTHROSCOPY DSU (CUSTOM PROCEDURE TRAY) ×2 IMPLANT
PACK BASIN DAY SURGERY FS (CUSTOM PROCEDURE TRAY) ×2 IMPLANT
PAD ALCOHOL SWAB (MISCELLANEOUS) ×2 IMPLANT
SET ARTHROSCOPY TUBING (MISCELLANEOUS) ×1
SET ARTHROSCOPY TUBING LN (MISCELLANEOUS) ×1 IMPLANT
SPONGE GAUZE 4X4 12PLY (GAUZE/BANDAGES/DRESSINGS) ×2 IMPLANT
SUCTION FRAZIER TIP 10 FR DISP (SUCTIONS) IMPLANT
SUT ETHILON 4 0 PS 2 18 (SUTURE) ×2 IMPLANT
SUT PROLENE 3 0 PS 2 (SUTURE) IMPLANT
SUT VIC AB 3-0 PS1 18 (SUTURE)
SUT VIC AB 3-0 PS1 18XBRD (SUTURE) IMPLANT
SYR 20CC LL (SYRINGE) ×2 IMPLANT
SYR 5ML LL (SYRINGE) IMPLANT
TOWEL OR 17X24 6PK STRL BLUE (TOWEL DISPOSABLE) ×2 IMPLANT
WAND STAR VAC 90 (SURGICAL WAND) IMPLANT
WATER STERILE IRR 1000ML POUR (IV SOLUTION) ×2 IMPLANT

## 2011-03-13 NOTE — Anesthesia Procedure Notes (Addendum)
Procedure Name: LMA Insertion Date/Time: 03/13/2011 9:28 AM Performed by: Gladys Damme Pre-anesthesia Checklist: Patient identified, Timeout performed, Emergency Drugs available, Suction available and Patient being monitored Patient Re-evaluated:Patient Re-evaluated prior to inductionOxygen Delivery Method: Circle System Utilized Preoxygenation: Pre-oxygenation with 100% oxygen Intubation Type: IV induction Ventilation: Mask ventilation without difficulty LMA: LMA inserted LMA Size: 4.0 Number of attempts: 1 Placement Confirmation: breath sounds checked- equal and bilateral and positive ETCO2 Tube secured with: Tape Dental Injury: Teeth and Oropharynx as per pre-operative assessment    Anesthesia Regional Block:   Narrative:

## 2011-03-13 NOTE — Transfer of Care (Signed)
Immediate Anesthesia Transfer of Care Note  Patient: Amy Owens  Procedure(s) Performed:  KNEE ARTHROSCOPY WITH MEDIAL MENISECTOMY - Medial and Lateral Chondroplasty.  Patient Location: PACU  Anesthesia Type: GA combined with regional for post-op pain  Level of Consciousness: awake and alert   Airway & Oxygen Therapy: Patient Spontanous Breathing and Patient connected to face mask oxygen  Post-op Assessment: Report given to PACU RN and Post -op Vital signs reviewed and stable  Post vital signs: Reviewed and stable Filed Vitals:   03/13/11 0810  BP: 140/92  Pulse: 60  Temp: 37 C  Resp: 20    Complications: No apparent anesthesia complications

## 2011-03-13 NOTE — Interval H&P Note (Signed)
History and Physical Interval Note:  03/13/2011 9:10 AM  Amy Owens  has presented today for surgery, with the diagnosis of left knee mmt  The various methods of treatment have been discussed with the patient and family. After consideration of risks, benefits and other options for treatment, the patient has consented to  Procedure(s): KNEE ARTHROSCOPY WITH MEDIAL MENISECTOMY LEFT KNEE as a surgical intervention .  The patients' history has been reviewed, patient examined, no change in status, stable for surgery.  I have reviewed the patients' chart and labs.  Questions were answered to the patient's satisfaction.     Salvatore Marvel A

## 2011-03-13 NOTE — Brief Op Note (Signed)
03/13/2011  9:53 AM  PATIENT:  Amy Owens  56 y.o. female  PRE-OPERATIVE DIAGNOSIS:  left knee mmt  POST-OPERATIVE DIAGNOSIS:  * No post-op diagnosis entered *  PROCEDURE:  Procedure(s): KNEE ARTHROSCOPY WITH MEDIAL  AND LATERAL MENISECTOMY LEFT  SURGEON:  Surgeon(s): Nilda Simmer, MD  PHYSICIAN ASSISTANT:   ASSISTANTS: NONE  ANESTHESIA:   general  EBL:  Total I/O In: 600 [I.V.:600] Out: -   BLOOD ADMINISTERED:none  DRAINS: none   LOCAL MEDICATIONS USED:  NONE  SPECIMEN:  No Specimen  DISPOSITION OF SPECIMEN:  N/A  COUNTS:  YES  TOURNIQUET:  * Missing tourniquet times found for documented tourniquets in log:  [id number] *  DICTATION: .Other Dictation: Dictation Number [id number]  PLAN OF CARE: Discharge to home after PACU  PATIENT DISPOSITION:  PACU - hemodynamically stable.   Delay start of Pharmacological VTE agent (>24hrs) due to surgical blood loss or risk of bleeding:  NA

## 2011-03-13 NOTE — Anesthesia Preprocedure Evaluation (Signed)
Anesthesia Evaluation  Patient identified by MRN, date of birth, ID band Patient awake    Reviewed: Allergy & Precautions, H&P , NPO status , Patient's Chart, lab work & pertinent test results, reviewed documented beta blocker date and time   Airway Mallampati: II TM Distance: >3 FB Neck ROM: Full    Dental No notable dental hx. (+) Teeth Intact   Pulmonary neg pulmonary ROS,  clear to auscultation  Pulmonary exam normal       Cardiovascular hypertension, On Medications and On Home Beta Blockers Regular Normal    Neuro/Psych  Headaches, Negative Psych ROS   GI/Hepatic negative GI ROS, Neg liver ROS,   Endo/Other  Negative Endocrine ROSDiabetes mellitus-  Renal/GU negative Renal ROS  Genitourinary negative   Musculoskeletal   Abdominal   Peds  Hematology negative hematology ROS (+)   Anesthesia Other Findings   Reproductive/Obstetrics negative OB ROS                           Anesthesia Physical Anesthesia Plan  ASA: II  Anesthesia Plan: General   Post-op Pain Management:    Induction: Intravenous  Airway Management Planned: LMA  Additional Equipment:   Intra-op Plan:   Post-operative Plan: Extubation in OR  Informed Consent: I have reviewed the patients History and Physical, chart, labs and discussed the procedure including the risks, benefits and alternatives for the proposed anesthesia with the patient or authorized representative who has indicated his/her understanding and acceptance.     Plan Discussed with: CRNA  Anesthesia Plan Comments:         Anesthesia Quick Evaluation

## 2011-03-13 NOTE — Progress Notes (Signed)
Assisted Dr. Sampson Goon with left knee block. Side rails up, monitors on throughout procedure. See vital signs in flow sheet. Tolerated Procedure well.

## 2011-03-13 NOTE — Anesthesia Postprocedure Evaluation (Signed)
  Anesthesia Post-op Note  Patient: Amy Owens  Procedure(s) Performed:  KNEE ARTHROSCOPY WITH MEDIAL MENISECTOMY - Medial and Lateral Chondroplasty.  Patient Location: PACU  Anesthesia Type: General  Level of Consciousness: awake  Airway and Oxygen Therapy: Patient Spontanous Breathing and Patient connected to face mask oxygen  Post-op Pain: none  Post-op Assessment: Post-op Vital signs reviewed, Patient's Cardiovascular Status Stable, Respiratory Function Stable and Patent Airway  Post-op Vital Signs: Reviewed and stable  Complications: No apparent anesthesia complications

## 2011-03-13 NOTE — H&P (View-Only) (Signed)
Amy Owens is an 56 y.o. female.   Chief Complaint: LEFT KNEE PAIN HPI: Amy Owens is a 56 year old seen with Dr. Farris Has for significant left knee pain increasing in nature. This has been bothering her for 1-2 months. Pain with twisting and turning relieved by rest. This is getting progressively worse. She had a MRI of the left knee on 03/02/11 that revealed a medial meniscus tear with a large joint effusion and a leaking Baker's cyst as well as grade IV superior apical patellofemoral chondromalacia. She's having significant difficulty walking and bending secondary to pain. She's taking antiinflammatories with minimal relief. She had aspirations and injections with temporary relief.  Past Medical History  Diagnosis Date  . Chest pain     atypical, neg stress test 2009  . DM (diabetes mellitus)     borderline   . Migraine headache   . Hypertension   . Hyperlipidemia   . Perimenopausal   . Anxiety   . Insomnia     sleep disorder unspec    Past Surgical History  Procedure Date  . Colonoscopy w/ polypectomy 2006  . Knee arthroscopy 2007  . Tubal ligation   . Cesarean section     Family History  Problem Relation Age of Onset  . Coronary artery disease Mother     68s  . Diabetes Mother   . Coronary artery disease Father     53s  . Diabetes Sister   . Diabetes Brother   . Diabetes Brother    Social History:  reports that she has never smoked. She does not have any smokeless tobacco history on file. She reports that she does not drink alcohol or use illicit drugs.  Allergies: No Known Allergies  Medications Prior to Admission  Medication Sig Dispense Refill  . amLODipine (NORVASC) 10 MG tablet Take 1 tablet (10 mg total) by mouth daily.  90 tablet  2  . aspirin (BABY ASPIRIN) 81 MG chewable tablet Chew 81 mg by mouth daily.        . cloNIDine (CATAPRES) 0.2 MG tablet Take 0.2 mg by mouth daily.        Marland Kitchen HYDROcodone-acetaminophen (NORCO) 5-325 MG per tablet Take 1  tablet by mouth every 6 (six) hours as needed.      . metoprolol (LOPRESSOR) 100 MG tablet TAKE 1 TABLET IN THE MORNING AND ONE-HALF (1/2) TABLET IN THE EVENING  180 tablet  2  . metoprolol (TOPROL-XL) 100 MG 24 hr tablet Take 100 mg by mouth. 1 tab am, 1/2 tab pm       . rosuvastatin (CRESTOR) 10 MG tablet Take 1 tablet (10 mg total) by mouth daily.  90 tablet  2  . triamterene-hydrochlorothiazide (DYAZIDE) 37.5-25 MG per capsule Take 1 each (1 capsule total) by mouth every morning.  90 capsule  2  . zolpidem (AMBIEN) 10 MG tablet Take 10 mg by mouth at bedtime as needed.      Marland Kitchen DISCONTD: cloNIDine (CATAPRES) 0.2 MG tablet Take 1 tablet (0.2 mg total) by mouth 2 (two) times daily.  90 tablet  3   No current facility-administered medications on file as of 03/10/2011.    No results found for this or any previous visit (from the past 48 hour(s)). No results found.  Review of Systems  Constitutional: Negative.   HENT: Negative.   Eyes: Negative.   Respiratory: Negative.   Cardiovascular: Negative.   Gastrointestinal: Negative.   Genitourinary: Negative.   Musculoskeletal: Positive for joint pain.  LEFT KNEE  Skin: Negative.   Neurological: Negative.   Endo/Heme/Allergies: Negative.   Psychiatric/Behavioral: Negative.     There were no vitals taken for this visit. Physical Exam  Constitutional: She is oriented to person, place, and time. She appears well-developed and well-nourished.  HENT:  Head: Normocephalic and atraumatic.  Mouth/Throat: Oropharynx is clear and moist.  Eyes: Conjunctivae and EOM are normal. Pupils are equal, round, and reactive to light.  Neck: Neck supple.  Cardiovascular: Normal heart sounds.   Respiratory: Effort normal.  GI: Soft.  Genitourinary:       Not pertinent to current symptomatology therefore not examined.  Musculoskeletal:       Examination of her left knee reveals 1 to 2+ effusion pain medially, range of motion 0-120 degrees positive  medial McMurray's knee is stable to ligamentous exam with normal patella tracking. Exam of the right knee reveals full range of motion without pain swelling weakness or instability. Vascular exam: pulses 2+ and symmetric.   Neurological: She is alert and oriented to person, place, and time.  Skin: Skin is warm and dry.  Psychiatric: She has a normal mood and affect. Her behavior is normal. Judgment and thought content normal.     Assessment Patient Active Problem List  Diagnoses  . HYPERLIPIDEMIA  . ANXIETY  . INSOMNIA-SLEEP DISORDER-UNSPEC  . MIGRAINE HEADACHE  . HYPERTENSION  . BRADYCARDIA  . PERIMENOPAUSAL STATUS  . EDEMA  . SYSTOLIC MURMUR  . DIABETES MELLITUS, BORDERLINE  . MEDIAL MENISCUS TEAR LEFT KNEE    Plan I talk to her and her husband about this in detail. I recommend we proceed with left knee arthroscopy due to failed conservative care. Discussed risks benefits and possible complications of the surgery in detail and they understand this completely.   Dajanay Northrup J 03/10/2011, 1:34 PM

## 2011-03-15 NOTE — Op Note (Signed)
NAME:  ZAKIYAH, DIOP        ACCOUNT NO.:  MEDICAL RECORD NO.:  [id number]  LOCATION:                                 FACILITY:  PHYSICIAN:  Moustapha Tooker A. Thurston Hole, M.D.      DATE OF BIRTH:  DATE OF PROCEDURE:  03/13/2011 DATE OF DISCHARGE:                              OPERATIVE REPORT   PREOPERATIVE DIAGNOSIS:  Left knee medial and lateral meniscal tears with chondromalacia.  POSTOPERATIVE DIAGNOSIS:  Left knee medial and lateral meniscal tears with chondromalacia.  PROCEDURE:  Left knee EUA, followed by arthroscopic partial medial lateral meniscectomies with chondroplasty.  SURGEON:  Elana Alm. Thurston Hole, M.D.  ANESTHESIA:  General.  OPERATIVE TIME:  30 minutes.  COMPLICATIONS:  None.  INDICATIONS FOR PROCEDURE:  Ms. Conaty is a 56 year old woman who has had 3 months of increasing left knee pain with exam on MRI documenting meniscal tearing with chondromalacia.  She has failed conservative care and is now to undergo arthroscopy.  DESCRIPTION:  Ms. Abeln was brought into the operating room on March 13, 2011, after a knee block was placed in the holding room by Anesthesia.  She was placed on the operative room table in supine position.  She received Ancef 1 g IV preoperatively for prophylaxis. After being placed under general anesthesia, her left knee was examined. She had full range of motion.  Knee was stable.  Ligamentous exam with normal patellar tracking.  Left leg was prepped using sterile DuraPrep and draped using sterile technique.  Time-out procedure was called and the correct left knee identified.  Initially, through an anterolateral portal, the arthroscope with a pump attached was placed into an anteromedial portal, and arthroscopic probe was placed.  On initial inspection of the medial compartment, she had grade 1 and 2 chondromalacia.  Medial meniscus showed a split posterior medial horn tear of which 40% was resected back to a  stable rim.  Intercondylar notch inspected anterior and posterior cruciate ligaments were normal. Lateral compartment inspected.  She had 30-40% grade 3 chondromalacia and lateral tibial plateau which was debrided.  Lateral femoral condyle was intact.  Lateral meniscus showed a partial anterior horn tear 20% which was resected back to a stable rim.  The rest of the lateral meniscus was intact.  Patellofemoral joint showed 50-75% grade 3 chondromalacia on the patella which was debrided.  Femoral groove showed only grade 1 and 2 changes.  The patella tracked normally.  Medial and lateral gutters were free of pathology.  After this was done, it was felt that all pathology had been satisfactorily addressed.  The instruments were removed.  Portals were closed with 3-0 nylon suture. Sterile dressings were applied.  The patient awakened, taken to recovery in stable condition.  FOLLOWUP CARE:  Ms. Mandella will be followed as an outpatient on Percocet for pain.  She will be back in the office in a week for sutures out and followup.     Jullie Arps A. Thurston Hole, M.D.     RAW/MEDQ  D:  03/13/2011  T:  03/14/2011  Job:  [id number]

## 2011-04-17 ENCOUNTER — Ambulatory Visit (INDEPENDENT_AMBULATORY_CARE_PROVIDER_SITE_OTHER): Payer: 59 | Admitting: Internal Medicine

## 2011-04-17 DIAGNOSIS — R739 Hyperglycemia, unspecified: Secondary | ICD-10-CM

## 2011-04-17 DIAGNOSIS — R7309 Other abnormal glucose: Secondary | ICD-10-CM

## 2011-04-17 DIAGNOSIS — Z0000 Encounter for general adult medical examination without abnormal findings: Secondary | ICD-10-CM | POA: Insufficient documentation

## 2011-04-17 LAB — POTASSIUM: Potassium: 3.5 mEq/L (ref 3.5–5.1)

## 2011-04-17 LAB — LIPID PANEL
HDL: 49.2 mg/dL (ref >39.00)
LDL Cholesterol: 62 mg/dL (ref 0–99)
VLDL: 22.4 mg/dL (ref 0.0–40.0)

## 2011-04-17 LAB — TSH: TSH: 1.14 u[IU]/mL (ref 0.35–5.50)

## 2011-04-17 NOTE — Patient Instructions (Signed)
Please see your gynecologist 

## 2011-04-17 NOTE — Progress Notes (Signed)
  Subjective:    Patient ID: Amy Owens, female    DOB: Sep 29, 1955, 56 y.o.   MRN: [id number]  HPI Complete physical exam, in general she feels well.  Past Medical History: HTN Hyperlipidemia  Borderline DM 4-09 ++ FH CAD atypical CP: 07-2007 neg stress test, normal EF by ECHO MIGRAINE HEADACHE  PERIMENOPAUSAL ANXIETY INSOMNIA  Past Surgical History: Colon polypectomy-2006 R knee arthroscopy -2007 Tubal ligation C-section L arthroscopy 02-2011   Social History: Married, original from [place] 2 sons and 1 daughter Never Smoked Alcohol use-no Drug use-no Regular exercise-  Unable to exercise much except stationary bike  Diet-- good   Family History:  CAD-- siblings x 3 (age in their 21s) DM - M, bro x2, sister  stroke - no colon Ca - no breast ca - no M - living - poor health F - deceased MI (58 y/o) Review of Systems Denies chest pain, shortness of breath. No cough. No nausea, vomiting, diarrhea or change in color of the stools. She takes meloxicam, very rarely she has epigastric burning. She does take it with meals. No disorder of gross hematuria No anxiety depression      Objective:   Physical Exam  Constitutional: She is oriented to person, place, and time. She appears well-developed and well-nourished.  HENT:  Head: Normocephalic and atraumatic.  Neck: No thyromegaly present.  Cardiovascular: Normal rate, regular rhythm and normal heart sounds.   No murmur heard. Pulmonary/Chest: Effort normal and breath sounds normal. No respiratory distress. She has no wheezes. She has no rales.  Abdominal: Soft. Bowel sounds are normal. She exhibits no distension. There is no tenderness. There is no rebound.  Musculoskeletal: She exhibits no edema.  Neurological: She is alert and oriented to person, place, and time.  Psychiatric: She has a normal mood and affect. Her behavior is normal. Judgment and thought content normal.      Assessment & Plan:

## 2011-04-17 NOTE — Assessment & Plan Note (Addendum)
Td 04  No recent  Gyn eval, used to see Dr Cherly Hensen , states she will call for an appointment  [facility] -- reports had one 2012 (normal) in [place] , no records  Cscope 2006: tubular adenomas, Dr Loreta Ave, was told to repeat in 5 years ----> refer to Dr Loreta Ave, discussed reason behind repeating cscopes Labs Diet-exercise discussed  Chronic medical problems seem well controlled, labs  Addendum: Reports a change in the color of the pretibial skin,  exam is confirmatory. Etiology unclear, we agreed to refer her to dermatology.

## 2011-04-18 ENCOUNTER — Encounter: Payer: Self-pay | Admitting: Internal Medicine

## 2011-04-19 ENCOUNTER — Telehealth: Payer: Self-pay | Admitting: Internal Medicine

## 2011-04-19 DIAGNOSIS — R21 Rash and other nonspecific skin eruption: Secondary | ICD-10-CM

## 2011-04-19 NOTE — Telephone Encounter (Signed)
I called to inform patient of her Gastroenterology appointment, and she states Dr. Drue Novel was also going to refer her to a Dermatologist.  No dermatology referral has been entered.

## 2011-04-20 ENCOUNTER — Encounter: Payer: Self-pay | Admitting: Internal Medicine

## 2011-04-20 NOTE — Telephone Encounter (Signed)
Yes, I forgot to enter referral at the time of the visit. Referral enterd today

## 2011-05-02 ENCOUNTER — Telehealth: Payer: Self-pay

## 2011-05-02 NOTE — Telephone Encounter (Signed)
Mr. Amy Owens called regarding patient's referral to a dermatologist.  They saw the dermatologist and they suggest her change her medications: Amlodipine and Triamterene

## 2011-05-04 NOTE — Telephone Encounter (Signed)
Call derm, need a copy of the OV note. (i did see the note from GI, she c/o constipation as one of her symptoms but GI did  not suggest change amlodipine. Nevertheless we'll wait for the dermatology note and will consider discontinue amlodipine which may aggravate  constipation)

## 2011-05-12 NOTE — Telephone Encounter (Signed)
Have you received the dermatology note?

## 2011-05-17 NOTE — Telephone Encounter (Signed)
LMOVM for pt to return call 

## 2011-05-17 NOTE — Telephone Encounter (Signed)
Note reviewed, was dx w/ purpura and suggested to consider change meds if the rash is not better; let her know, if no better schedule a OV to change her meds

## 2011-05-19 NOTE — Telephone Encounter (Signed)
Spoke with pt's son. Pt still has a rash & scheduled an appt for next 3.27.13

## 2011-05-21 ENCOUNTER — Other Ambulatory Visit: Payer: Self-pay | Admitting: Internal Medicine

## 2011-05-22 NOTE — Telephone Encounter (Signed)
Refill request for Ambien 10mg  #30 with 3 refills. Last refilled on 8.13.12. OK to refill?

## 2011-05-24 ENCOUNTER — Ambulatory Visit (INDEPENDENT_AMBULATORY_CARE_PROVIDER_SITE_OTHER): Payer: 59 | Admitting: Internal Medicine

## 2011-05-24 ENCOUNTER — Other Ambulatory Visit: Payer: Self-pay | Admitting: Internal Medicine

## 2011-05-24 VITALS — BP 126/82 | HR 53 | Temp 98.5°F | Wt 162.0 lb

## 2011-05-24 DIAGNOSIS — I10 Essential (primary) hypertension: Secondary | ICD-10-CM

## 2011-05-24 DIAGNOSIS — R21 Rash and other nonspecific skin eruption: Secondary | ICD-10-CM

## 2011-05-24 MED ORDER — ZOLPIDEM TARTRATE 10 MG PO TABS: 10.0000 mg | ORAL_TABLET | Freq: Every evening | ORAL | Status: DC | PRN

## 2011-05-24 NOTE — Assessment & Plan Note (Addendum)
Chart reviewed, see overview Dermatology suggested to discontinue amlodipine as it may be causing the pretibial hyperpigmentation Plan: DC amlodipine Samples of benicar 20 mg provided, we'll see if she can tolerate it better than losartan (there was a question of intolerance before. ) Office visit in 5 weeks

## 2011-05-24 NOTE — Telephone Encounter (Signed)
Refill for zolpidem 10mg  tablets  Take 1-tablet by mouth at bedtime as needed for sleep  Qty 30 Last filled 2.16.13

## 2011-05-24 NOTE — Patient Instructions (Signed)
Check the  blood pressure 2 or 3 times a week, be sure it is less than 140/85. If it is consistently higher, let me know  

## 2011-05-24 NOTE — Telephone Encounter (Signed)
OK to refill

## 2011-05-24 NOTE — Telephone Encounter (Signed)
30, 4 RF 

## 2011-05-24 NOTE — Telephone Encounter (Signed)
Refill done.  

## 2011-05-24 NOTE — Progress Notes (Signed)
  Subjective:    Patient ID: Amy Owens, female    DOB: 12/28/1955, 56 y.o.   MRN: [id number]  HPI Here at the request of dermatology Dermatology note is reviewed, they diagnosed her with purpura and suggested changing her BP medication. According to the patient amlodipine was mentioned b/c is a vasodilator.  Past Medical History:  HTN  Hyperlipidemia  Borderline DM 4-09  ++ FH CAD  atypical CP: 07-2007 neg stress test, normal EF by ECHO  MIGRAINE HEADACHE  PERIMENOPAUSAL  ANXIETY  INSOMNIA  Past Surgical History:  Colon polypectomy-2006  R knee arthroscopy -2007  Tubal ligation  C-section  L arthroscopy 02-2011    Review of Systems Other than the skin problems, she feels well.     Objective:   Physical Exam  Alert oriented, no apparent distress, BP 126/82. Extremities: No edema, she does have hyperpigmentation of both pretibial areas.       Assessment & Plan:  Today , I spent more than 15 min with the patient, >50% of the time counseling, and reviewing the chart

## 2011-05-24 NOTE — Assessment & Plan Note (Signed)
Has a pretibial hyperpigmentation, saw dermatology, they think it may be due to amlodipine (peripheral vasodilator). They suggested to change to another agent.  See hypertension

## 2011-05-25 ENCOUNTER — Encounter: Payer: Self-pay | Admitting: Internal Medicine

## 2011-06-05 ENCOUNTER — Other Ambulatory Visit: Payer: Self-pay | Admitting: *Deleted

## 2011-06-05 MED ORDER — METOPROLOL SUCCINATE ER 100 MG PO TB24: ORAL_TABLET | ORAL | Status: DC

## 2011-06-05 NOTE — Telephone Encounter (Signed)
Rx sent 

## 2011-06-12 ENCOUNTER — Telehealth: Payer: Self-pay | Admitting: *Deleted

## 2011-06-12 NOTE — Telephone Encounter (Signed)
Pt husband states that Pt had been on the tartrate of the metoprolol but recent Rx was sent in for succinate. Pt husband would like to know if this is safe to take or does Pt need to go back to the tartrate..Please advise

## 2011-06-12 NOTE — Telephone Encounter (Signed)
It is safe, if she is feeling well, with don't  need to change anything.

## 2011-06-12 NOTE — Telephone Encounter (Signed)
Pt husband is requesting that Rx be change back to tartrate because it is cheaper for her on his plan and the succinate is a tier 2.Please advise

## 2011-06-13 MED ORDER — METOPROLOL TARTRATE 100 MG PO TABS: ORAL_TABLET | ORAL | Status: DC

## 2011-06-13 NOTE — Telephone Encounter (Signed)
Ok, Rx is ready to be sent to her pharmacy

## 2011-06-13 NOTE — Telephone Encounter (Signed)
Rx sent, left detail message of med change.

## 2011-06-28 ENCOUNTER — Ambulatory Visit (INDEPENDENT_AMBULATORY_CARE_PROVIDER_SITE_OTHER): Payer: 59 | Admitting: Internal Medicine

## 2011-06-28 ENCOUNTER — Encounter: Payer: Self-pay | Admitting: Internal Medicine

## 2011-06-28 VITALS — BP 130/82 | HR 59 | Temp 98.2°F | Wt 159.0 lb

## 2011-06-28 DIAGNOSIS — R21 Rash and other nonspecific skin eruption: Secondary | ICD-10-CM

## 2011-06-28 DIAGNOSIS — I10 Essential (primary) hypertension: Secondary | ICD-10-CM

## 2011-06-28 LAB — BASIC METABOLIC PANEL
BUN: 28 mg/dL — ABNORMAL HIGH (ref 6–23)
CO2: 26 mEq/L (ref 19–32)
Calcium: 9.7 mg/dL (ref 8.4–10.5)
Creatinine, Ser: 0.8 mg/dL (ref 0.4–1.2)

## 2011-06-28 MED ORDER — TRIAMTERENE-HCTZ 37.5-25 MG PO CAPS: 1.0000 | ORAL_CAPSULE | ORAL | Status: DC

## 2011-06-28 MED ORDER — OLMESARTAN MEDOXOMIL 20 MG PO TABS: 20.0000 mg | ORAL_TABLET | Freq: Every day | ORAL | Status: DC

## 2011-06-28 MED ORDER — ROSUVASTATIN CALCIUM 10 MG PO TABS: 10.0000 mg | ORAL_TABLET | Freq: Every day | ORAL | Status: DC

## 2011-06-28 NOTE — Progress Notes (Signed)
  Subjective:    Patient ID: Amy Owens, female    DOB: 04-04-1955, 56 y.o.   MRN: [id number]  HPI Followup from previous visit. BP medications were changed at the request of her dermatologist. Good compliance, and good tolerance of all her medicines, ambulatory BP 120/80 or better.  Past Medical History:  HTN  Hyperlipidemia  Borderline DM 4-09  ++ FH CAD  atypical CP: 07-2007 neg stress test, normal EF by ECHO  MIGRAINE HEADACHE  PERIMENOPAUSAL  ANXIETY  INSOMNIA  Past Surgical History:  Colon polypectomy-2006  R knee arthroscopy -2007  Tubal ligation  C-section  L arthroscopy 02-2011    Review of Systems Denies symptoms of overcontrolled BP such as dizziness. The rash at her legs is about the same, maybe slightly less pigmented. Her orthopedic doctor changed Mobic to Celebrex, Celebrex working better. No nausea, vomiting.    Objective:   Physical Exam  General -- alert, well-developed Lungs -- normal respiratory effort, no intercostal retractions, no accessory muscle use, and normal breath sounds.   Heart-- normal rate, regular rhythm, no murmur, and no gallop.   Extremities-- no pretibial edema bilaterally , dry hyperpigmented pretibial skin B. Neurologic-- alert & oriented X3 and strength normal in all extremities. Psych-- Cognition and judgment appear intact. Alert and cooperative with normal attention span and concentration.  not anxious appearing and not depressed appearing.      Assessment & Plan:

## 2011-06-28 NOTE — Assessment & Plan Note (Addendum)
Current medications working really well for the patient. She wonders if we should discontinue the diuretic as suggested by dermatology. Her rash is maybe slightly better since we discontinue amlodipine; she is doing so well that I don't think that is appropriate to change his BP meds at this time. Plan: No change, BMP, return to the office in 6 months

## 2011-06-28 NOTE — Assessment & Plan Note (Signed)
See "HTN"

## 2011-06-29 ENCOUNTER — Encounter: Payer: Self-pay | Admitting: *Deleted

## 2011-10-16 ENCOUNTER — Ambulatory Visit: Payer: 59 | Admitting: Internal Medicine

## 2011-10-23 ENCOUNTER — Ambulatory Visit: Payer: 59 | Admitting: Internal Medicine

## 2012-05-10 ENCOUNTER — Other Ambulatory Visit: Payer: Self-pay | Admitting: *Deleted

## 2012-05-10 MED ORDER — METOPROLOL TARTRATE 100 MG PO TABS: ORAL_TABLET | ORAL | Status: DC

## 2012-05-10 MED ORDER — OLMESARTAN MEDOXOMIL 20 MG PO TABS: 20.0000 mg | ORAL_TABLET | Freq: Every day | ORAL | Status: DC

## 2012-05-10 MED ORDER — ROSUVASTATIN CALCIUM 10 MG PO TABS: 10.0000 mg | ORAL_TABLET | Freq: Every day | ORAL | Status: DC

## 2012-05-10 MED ORDER — TRIAMTERENE-HCTZ 37.5-25 MG PO CAPS: 1.0000 | ORAL_CAPSULE | ORAL | Status: DC

## 2012-05-10 NOTE — Addendum Note (Signed)
Addended by: Candie Echevaria L on: 05/10/2012 09:27 AM   Modules accepted: Orders

## 2012-05-10 NOTE — Telephone Encounter (Signed)
Rx sent 

## 2012-07-04 ENCOUNTER — Other Ambulatory Visit: Payer: Self-pay | Admitting: Internal Medicine

## 2012-07-05 ENCOUNTER — Other Ambulatory Visit: Payer: Self-pay | Admitting: Internal Medicine

## 2012-07-05 ENCOUNTER — Encounter: Payer: Self-pay | Admitting: *Deleted

## 2012-07-05 NOTE — Telephone Encounter (Signed)
Ok #30, 1 RF, please call pt: needs OV (CPX) please arrange

## 2012-07-05 NOTE — Telephone Encounter (Signed)
Ok to refill? Last OV 5.1.13 Last filled 3.27.13

## 2012-07-05 NOTE — Telephone Encounter (Signed)
Pt made aware rx & controlled substance contract is ready to be picked up at front desk & needs to schedule a CPX.

## 2012-07-05 NOTE — Telephone Encounter (Signed)
Pt made aware she is due for a CPX.

## 2012-07-30 ENCOUNTER — Encounter: Payer: Self-pay | Admitting: Internal Medicine

## 2012-09-16 ENCOUNTER — Ambulatory Visit (INDEPENDENT_AMBULATORY_CARE_PROVIDER_SITE_OTHER): Payer: BC Managed Care – PPO | Admitting: Internal Medicine

## 2012-09-16 ENCOUNTER — Telehealth: Payer: Self-pay | Admitting: Internal Medicine

## 2012-09-16 ENCOUNTER — Encounter: Payer: Self-pay | Admitting: Internal Medicine

## 2012-09-16 ENCOUNTER — Other Ambulatory Visit: Payer: Self-pay | Admitting: *Deleted

## 2012-09-16 VITALS — BP 124/82 | HR 54 | Temp 98.0°F | Ht 59.0 in | Wt 153.8 lb

## 2012-09-16 DIAGNOSIS — Z23 Encounter for immunization: Secondary | ICD-10-CM

## 2012-09-16 DIAGNOSIS — R7309 Other abnormal glucose: Secondary | ICD-10-CM

## 2012-09-16 DIAGNOSIS — F519 Sleep disorder not due to a substance or known physiological condition, unspecified: Secondary | ICD-10-CM

## 2012-09-16 DIAGNOSIS — Z0000 Encounter for general adult medical examination without abnormal findings: Secondary | ICD-10-CM

## 2012-09-16 DIAGNOSIS — Z2911 Encounter for prophylactic immunotherapy for respiratory syncytial virus (RSV): Secondary | ICD-10-CM

## 2012-09-16 LAB — CBC WITH DIFFERENTIAL/PLATELET
Basophils Relative: 0.4 % (ref 0.0–3.0)
Eosinophils Absolute: 0.3 10*3/uL (ref 0.0–0.7)
Eosinophils Relative: 3.8 % (ref 0.0–5.0)
HCT: 39.2 % (ref 36.0–46.0)
Lymphs Abs: 2.4 10*3/uL (ref 0.7–4.0)
MCHC: 34.1 g/dL (ref 30.0–36.0)
MCV: 93.8 fl (ref 78.0–100.0)
Monocytes Absolute: 0.5 10*3/uL (ref 0.1–1.0)
Neutro Abs: 4.7 10*3/uL (ref 1.4–7.7)
Neutrophils Relative %: 59.2 % (ref 43.0–77.0)
RBC: 4.18 Mil/uL (ref 3.87–5.11)
WBC: 7.9 10*3/uL (ref 4.5–10.5)

## 2012-09-16 LAB — COMPREHENSIVE METABOLIC PANEL
AST: 21 U/L (ref 0–37)
Albumin: 4.8 g/dL (ref 3.5–5.2)
Alkaline Phosphatase: 57 U/L (ref 39–117)
BUN: 39 mg/dL — ABNORMAL HIGH (ref 6–23)
Potassium: 4 mEq/L (ref 3.5–5.1)
Sodium: 142 mEq/L (ref 135–145)
Total Bilirubin: 0.4 mg/dL (ref 0.3–1.2)

## 2012-09-16 LAB — TSH: TSH: 2.78 u[IU]/mL (ref 0.35–5.50)

## 2012-09-16 LAB — LIPID PANEL
LDL Cholesterol: 77 mg/dL (ref 0–99)
Total CHOL/HDL Ratio: 3
Triglycerides: 89 mg/dL (ref 0.0–149.0)

## 2012-09-16 MED ORDER — ROSUVASTATIN CALCIUM 10 MG PO TABS: 10.0000 mg | ORAL_TABLET | Freq: Every day | ORAL | Status: DC

## 2012-09-16 MED ORDER — METOPROLOL TARTRATE 100 MG PO TABS: ORAL_TABLET | ORAL | Status: DC

## 2012-09-16 MED ORDER — ASPIRIN 81 MG PO CHEW: 81.0000 mg | CHEWABLE_TABLET | Freq: Every day | ORAL | Status: AC

## 2012-09-16 MED ORDER — CLONIDINE HCL 0.2 MG PO TABS: 0.2000 mg | ORAL_TABLET | Freq: Every day | ORAL | Status: DC

## 2012-09-16 MED ORDER — OLMESARTAN MEDOXOMIL 20 MG PO TABS: ORAL_TABLET | ORAL | Status: DC

## 2012-09-16 MED ORDER — TRIAMTERENE-HCTZ 37.5-25 MG PO CAPS: 1.0000 | ORAL_CAPSULE | ORAL | Status: DC

## 2012-09-16 NOTE — Patient Instructions (Addendum)
Next visit in 4 to 6 months  

## 2012-09-16 NOTE — Assessment & Plan Note (Addendum)
Td today zostavax discussed and provided today No recent  Gyn eval, used to see Dr Cherly Hensen , declined referral, states will call for appointment [facility] -- reports had one 2012 (normal) in [place] , declined referral  Cscope 2006: tubular adenomas, Cscope ahgain 06-2011, 1 polyp, not recovered, next 5 years per report Dr Loreta Ave Diet-exercise discussed  Chronic medical problems seem well controlled, labs   .

## 2012-09-16 NOTE — Progress Notes (Signed)
Done

## 2012-09-16 NOTE — Progress Notes (Signed)
  Subjective:    Patient ID: Amy Owens, female    DOB: 09-21-1955, 57 y.o.   MRN: [id number]  HPI CPX  Past Medical History:   HTN   Hyperlipidemia   Borderline DM 4-09   ++ FH CAD   atypical CP: 07-2007 neg stress test, normal EF by ECHO   MIGRAINE HEADACHE   PERIMENOPAUSAL   ANXIETY   INSOMNIA    Past Surgical History:   Colon polypectomy-2006   R knee arthroscopy -2007   Tubal ligation   C-section   L knee arthroscopy 02-2011   Social History: Married, original from [place] 2 sons and 1 daughter Never Smoked Alcohol use-no Drug use-no   Family History:  CAD-- siblings x 3 (age in their 71s), Mother MI age 11 (deceased) DM - M, bro x2, sister   stroke - no colon Ca - no breast ca - no F - deceased MI (59 y/o)  Review of Systems exercise-  walks 5/week  Diet-- good, healthy, improved x last month Medication list reviewed, good compliance.  Self d/c ambien due to headaches, now on Benadryl with good results. No CP-SOB No N-V-D No anxiety depression     Objective:   Physical Exam BP 124/82  Pulse 54  Temp(Src) 98 F (36.7 C) (Oral)  Ht 4\' 11"  (1.499 m)  Wt 153 lb 12.8 oz (69.763 kg)  BMI 31.05 kg/m2  SpO2 95% General -- alert, well-developed, NAD   Neck --no thyromegaly , normal carotid pulse Lungs -- normal respiratory effort, no intercostal retractions, no accessory muscle use, and normal breath sounds.   Heart-- normal rate, regular rhythm, no murmur, and no gallop.   Abdomen--soft, non-tender, no distention, no masses, no HSM, no guarding, and no rigidity.   Extremities-- no pretibial edema bilaterally Neurologic-- alert & oriented X3 and strength normal in all extremities. Psych-- Cognition and judgment appear intact. Alert and cooperative with normal attention span and concentration.  not anxious appearing and not depressed appearing.        Assessment & Plan:

## 2012-09-16 NOTE — Telephone Encounter (Signed)
Received message that E-scribe did not go through, printed Rx and faxed to pharmacy.

## 2012-09-16 NOTE — Assessment & Plan Note (Signed)
Discontinue Ambien did headaches, now doing okay with Benadryl as needed

## 2012-09-16 NOTE — Telephone Encounter (Signed)
Patient's husband is calling asking that we send the patient's Crestor and Triamterene medications to [facility] on [address] until her mail order prescriptions come in. She is completely out of these two.

## 2012-09-17 LAB — HEMOGLOBIN A1C: Hgb A1c MFr Bld: 5.8 % (ref 4.6–6.5)

## 2012-09-17 LAB — VITAMIN D 25 HYDROXY (VIT D DEFICIENCY, FRACTURES): Vit D, 25-Hydroxy: 45 ng/mL (ref 30–89)

## 2012-09-18 ENCOUNTER — Encounter: Payer: Self-pay | Admitting: *Deleted

## 2012-09-18 ENCOUNTER — Other Ambulatory Visit: Payer: Self-pay | Admitting: *Deleted

## 2012-09-18 DIAGNOSIS — E785 Hyperlipidemia, unspecified: Secondary | ICD-10-CM

## 2012-09-18 DIAGNOSIS — I10 Essential (primary) hypertension: Secondary | ICD-10-CM

## 2012-09-18 MED ORDER — TRIAMTERENE-HCTZ 37.5-25 MG PO CAPS: 1.0000 | ORAL_CAPSULE | ORAL | Status: DC

## 2012-09-18 MED ORDER — ROSUVASTATIN CALCIUM 10 MG PO TABS: 10.0000 mg | ORAL_TABLET | Freq: Every day | ORAL | Status: DC

## 2012-09-18 NOTE — Telephone Encounter (Signed)
Refills for crestor and dyazide sent to [facility] on [address]

## 2012-12-31 ENCOUNTER — Telehealth: Payer: Self-pay | Admitting: Internal Medicine

## 2012-12-31 DIAGNOSIS — I10 Essential (primary) hypertension: Secondary | ICD-10-CM

## 2012-12-31 DIAGNOSIS — E785 Hyperlipidemia, unspecified: Secondary | ICD-10-CM

## 2012-12-31 MED ORDER — TRIAMTERENE-HCTZ 37.5-25 MG PO CAPS: 1.0000 | ORAL_CAPSULE | ORAL | Status: DC

## 2012-12-31 MED ORDER — METOPROLOL TARTRATE 100 MG PO TABS: ORAL_TABLET | ORAL | Status: DC

## 2012-12-31 MED ORDER — ROSUVASTATIN CALCIUM 10 MG PO TABS: 10.0000 mg | ORAL_TABLET | Freq: Every day | ORAL | Status: DC

## 2012-12-31 MED ORDER — CLONIDINE HCL 0.2 MG PO TABS: 0.2000 mg | ORAL_TABLET | Freq: Every day | ORAL | Status: DC

## 2012-12-31 MED ORDER — OLMESARTAN MEDOXOMIL 20 MG PO TABS: ORAL_TABLET | ORAL | Status: DC

## 2012-12-31 NOTE — Telephone Encounter (Signed)
Patient called and requested refill's for a 90 day supply  Medications olmesartan (BENICAR) 20 MG tablet triamterene-hydrochlorothiazide (DYAZIDE) 37.5-25 MG per capsule cloNIDine (CATAPRES) 0.2 MG tablet metoprolol (LOPRESSOR) 100 MG tablet triamterene-hydrochlorothiazide (DYAZIDE) 37.5-25 MG per capsule rosuvastatin (CRESTOR) 10 MG tablet   Pharm[facility] - [place], [place] - [address]

## 2012-12-31 NOTE — Telephone Encounter (Signed)
rx refilled per protocol. DJR  

## 2013-03-25 ENCOUNTER — Encounter: Payer: Self-pay | Admitting: Internal Medicine

## 2013-03-25 ENCOUNTER — Ambulatory Visit (INDEPENDENT_AMBULATORY_CARE_PROVIDER_SITE_OTHER): Payer: BC Managed Care – PPO | Admitting: Internal Medicine

## 2013-03-25 VITALS — BP 129/82 | HR 60 | Temp 97.9°F | Wt 159.0 lb

## 2013-03-25 DIAGNOSIS — Z0000 Encounter for general adult medical examination without abnormal findings: Secondary | ICD-10-CM

## 2013-03-25 DIAGNOSIS — E785 Hyperlipidemia, unspecified: Secondary | ICD-10-CM

## 2013-03-25 DIAGNOSIS — Z23 Encounter for immunization: Secondary | ICD-10-CM

## 2013-03-25 DIAGNOSIS — I10 Essential (primary) hypertension: Secondary | ICD-10-CM

## 2013-03-25 LAB — BASIC METABOLIC PANEL
BUN: 33 mg/dL — ABNORMAL HIGH (ref 6–23)
CALCIUM: 9.8 mg/dL (ref 8.4–10.5)
CHLORIDE: 108 meq/L (ref 96–112)
CO2: 25 meq/L (ref 19–32)
Creatinine, Ser: 0.9 mg/dL (ref 0.4–1.2)
GFR: 67.17 mL/min (ref >60.00)
GLUCOSE: 106 mg/dL — AB (ref 70–99)
Potassium: 3.9 mEq/L (ref 3.5–5.1)
SODIUM: 141 meq/L (ref 135–145)

## 2013-03-25 LAB — AST: AST: 20 U/L (ref 0–37)

## 2013-03-25 LAB — ALT: ALT: 23 U/L (ref 0–35)

## 2013-03-25 MED ORDER — TRIAMTERENE-HCTZ 37.5-25 MG PO CAPS: 1.0000 | ORAL_CAPSULE | ORAL | Status: DC

## 2013-03-25 MED ORDER — METOPROLOL TARTRATE 100 MG PO TABS: ORAL_TABLET | ORAL | Status: DC

## 2013-03-25 MED ORDER — OLMESARTAN MEDOXOMIL 20 MG PO TABS: ORAL_TABLET | ORAL | Status: DC

## 2013-03-25 MED ORDER — CLONIDINE HCL 0.2 MG PO TABS
0.2000 mg | ORAL_TABLET | Freq: Every day | ORAL | Status: DC
Start: 2013-03-25 — End: 2014-02-24

## 2013-03-25 MED ORDER — ROSUVASTATIN CALCIUM 10 MG PO TABS: 10.0000 mg | ORAL_TABLET | Freq: Every day | ORAL | Status: DC

## 2013-03-25 NOTE — Assessment & Plan Note (Addendum)
Not seen by gyn, no MMG We agreed on referral to Dr Cherly Hensenousins and schedule a MMG

## 2013-03-25 NOTE — Patient Instructions (Signed)
Get your blood work before you leave   Next visit is for a physical exam in 6 months , fasting Please make an appointment    

## 2013-03-25 NOTE — Assessment & Plan Note (Signed)
Well-controlled, doing well with diet, amb BPs 120/80s Plan: Labs, refill meds

## 2013-03-25 NOTE — Assessment & Plan Note (Signed)
Well controlled without apparent side effects, check LFTs, refill meds

## 2013-03-25 NOTE — Progress Notes (Signed)
Pre visit review using our clinic review tool, if applicable. No additional management support is needed unless otherwise documented below in the visit note. 

## 2013-03-25 NOTE — Progress Notes (Signed)
   Subjective:    Patient ID: Amy Owens, female    DOB: 05-Jan-1956, 58 y.o.   MRN: [id number]  HPI Routine office visit Hypertension--good medication compliance, needs a refill. Ambulatory BPs within normal High cholesterol--needs a refill, no apparent side effects from statins.   Past Medical History:   HTN   Hyperlipidemia   Borderline DM 4-09   ++ FH CAD   atypical CP: 07-2007 neg stress test, normal EF by ECHO   MIGRAINE HEADACHE   PERIMENOPAUSAL   ANXIETY   INSOMNIA    Past Surgical History:   Colon polypectomy-2006   R knee arthroscopy -2007   Tubal ligation   C-section   L knee arthroscopy 02-2011   Social History: Married, original from [place] 2 sons and 1 daughter Never Smoked Alcohol use-no Drug use-no   Family History:  CAD-- siblings x 3 (age in their 5640s), Mother MI age 60 (deceased) DM - M, bro x2, sister   stroke - no colon Ca - no breast ca - no F - deceased MI 79(58 y/o)  Review of Systems Diet-- healthy Exercise-- none No  CP, SOB, lower extremity edema  (-) cough, sputum production  No anxiety, depression      Objective:   Physical Exam BP 129/82  Pulse 60  Temp(Src) 97.9 F (36.6 C)  Wt 159 lb (72.122 kg)  SpO2 99% General -- alert, well-developed, NAD.  Lungs -- normal respiratory effort, no intercostal retractions, no accessory muscle use, and normal breath sounds.  Heart-- normal rate, regular rhythm, no murmur.  Extremities-- no pretibial edema bilaterally  Neurologic--  alert & oriented X3. Speech normal, gait normal, strength normal in all extremities.  Psych-- Cognition and judgment appear intact. Cooperative with normal attention span and concentration. No anxious or depressed appearing.     Assessment & Plan:  Flu shot today

## 2013-03-26 ENCOUNTER — Encounter: Payer: Self-pay | Admitting: Internal Medicine

## 2013-03-31 ENCOUNTER — Encounter: Payer: Self-pay | Admitting: *Deleted

## 2013-04-02 ENCOUNTER — Telehealth: Payer: Self-pay | Admitting: Internal Medicine

## 2013-04-02 NOTE — Telephone Encounter (Signed)
Relevant patient education mailed to patient.  

## 2013-05-13 ENCOUNTER — Other Ambulatory Visit: Payer: Self-pay | Admitting: Internal Medicine

## 2013-05-13 DIAGNOSIS — Z1231 Encounter for screening mammogram for malignant neoplasm of breast: Secondary | ICD-10-CM

## 2013-05-23 ENCOUNTER — Ambulatory Visit
Admission: RE | Admit: 2013-05-23 | Discharge: 2013-05-23 | Disposition: A | Discharge status: Home / Self Care | Payer: Self-pay | Source: Ambulatory Visit | Attending: Internal Medicine | Admitting: Internal Medicine

## 2013-05-23 DIAGNOSIS — Z1231 Encounter for screening mammogram for malignant neoplasm of breast: Secondary | ICD-10-CM

## 2013-05-28 ENCOUNTER — Other Ambulatory Visit: Payer: Self-pay | Admitting: Internal Medicine

## 2013-05-28 DIAGNOSIS — R928 Other abnormal and inconclusive findings on diagnostic imaging of breast: Secondary | ICD-10-CM

## 2013-06-27 ENCOUNTER — Telehealth: Payer: Self-pay | Admitting: Internal Medicine

## 2013-06-27 NOTE — Telephone Encounter (Signed)
Advise patient, had abnormal mammogram 04/2013, was supposed to get additional views. I don't see the reports, recommend to schedule additional views at her earliest convenience. If she needs a referral let us know

## 2013-06-30 NOTE — Telephone Encounter (Signed)
Patient has an appt scheduled for May 8th for the follow up evaluation.

## 2013-06-30 NOTE — Telephone Encounter (Signed)
thx

## 2013-07-04 ENCOUNTER — Encounter (INDEPENDENT_AMBULATORY_CARE_PROVIDER_SITE_OTHER): Payer: Self-pay

## 2013-07-04 ENCOUNTER — Ambulatory Visit
Admission: RE | Admit: 2013-07-04 | Discharge: 2013-07-04 | Disposition: A | Discharge status: Home / Self Care | Payer: BC Managed Care – PPO | Source: Ambulatory Visit | Attending: Internal Medicine | Admitting: Internal Medicine

## 2013-07-04 DIAGNOSIS — R928 Other abnormal and inconclusive findings on diagnostic imaging of breast: Secondary | ICD-10-CM

## 2014-02-05 ENCOUNTER — Telehealth: Payer: Self-pay | Admitting: Internal Medicine

## 2014-02-05 DIAGNOSIS — R928 Other abnormal and inconclusive findings on diagnostic imaging of breast: Secondary | ICD-10-CM

## 2014-02-05 NOTE — Telephone Encounter (Signed)
Advise patient: Needs a diagnostic right mammogram with spot magnification views , please arrange

## 2014-02-05 NOTE — Telephone Encounter (Signed)
Repeat from diagnostic mammogram in 06/2013. Referral placed to Breast clinic.

## 2014-02-10 ENCOUNTER — Other Ambulatory Visit: Payer: Self-pay | Admitting: Internal Medicine

## 2014-02-10 DIAGNOSIS — R921 Mammographic calcification found on diagnostic imaging of breast: Secondary | ICD-10-CM

## 2014-02-24 ENCOUNTER — Ambulatory Visit (INDEPENDENT_AMBULATORY_CARE_PROVIDER_SITE_OTHER): Payer: BC Managed Care – PPO

## 2014-02-24 ENCOUNTER — Encounter: Payer: Self-pay | Admitting: Internal Medicine

## 2014-02-24 ENCOUNTER — Ambulatory Visit: Payer: BC Managed Care – PPO | Admitting: Internal Medicine

## 2014-02-24 ENCOUNTER — Ambulatory Visit
Admission: RE | Admit: 2014-02-24 | Discharge: 2014-02-24 | Disposition: A | Discharge status: Home / Self Care | Payer: BC Managed Care – PPO | Source: Ambulatory Visit | Attending: Internal Medicine | Admitting: Internal Medicine

## 2014-02-24 ENCOUNTER — Ambulatory Visit (INDEPENDENT_AMBULATORY_CARE_PROVIDER_SITE_OTHER): Payer: BC Managed Care – PPO | Admitting: Internal Medicine

## 2014-02-24 VITALS — BP 156/84 | HR 96 | Temp 98.6°F | Ht 59.0 in | Wt 158.5 lb

## 2014-02-24 DIAGNOSIS — Z23 Encounter for immunization: Secondary | ICD-10-CM

## 2014-02-24 DIAGNOSIS — I10 Essential (primary) hypertension: Secondary | ICD-10-CM

## 2014-02-24 DIAGNOSIS — Z0000 Encounter for general adult medical examination without abnormal findings: Secondary | ICD-10-CM

## 2014-02-24 DIAGNOSIS — R7303 Prediabetes: Secondary | ICD-10-CM

## 2014-02-24 DIAGNOSIS — E785 Hyperlipidemia, unspecified: Secondary | ICD-10-CM

## 2014-02-24 DIAGNOSIS — R921 Mammographic calcification found on diagnostic imaging of breast: Secondary | ICD-10-CM

## 2014-02-24 DIAGNOSIS — G4700 Insomnia, unspecified: Secondary | ICD-10-CM

## 2014-02-24 DIAGNOSIS — R7309 Other abnormal glucose: Secondary | ICD-10-CM

## 2014-02-24 LAB — LIPID PANEL
Cholesterol: 306 mg/dL — ABNORMAL HIGH (ref 0–200)
HDL: 43.4 mg/dL (ref >39.00)
NonHDL: 262.6
Total CHOL/HDL Ratio: 7
Triglycerides: 265 mg/dL — ABNORMAL HIGH (ref 0.0–149.0)
VLDL: 53 mg/dL — AB (ref 0.0–40.0)

## 2014-02-24 LAB — BASIC METABOLIC PANEL
BUN: 24 mg/dL — ABNORMAL HIGH (ref 6–23)
CO2: 27 meq/L (ref 19–32)
Calcium: 9.7 mg/dL (ref 8.4–10.5)
Chloride: 105 mEq/L (ref 96–112)
Creatinine, Ser: 1 mg/dL (ref 0.4–1.2)
GFR: 62.95 mL/min (ref >60.00)
GLUCOSE: 110 mg/dL — AB (ref 70–99)
Potassium: 4.5 mEq/L (ref 3.5–5.1)
SODIUM: 141 meq/L (ref 135–145)

## 2014-02-24 LAB — ALT: ALT: 33 U/L (ref 0–35)

## 2014-02-24 LAB — AST: AST: 23 U/L (ref 0–37)

## 2014-02-24 LAB — HEMOGLOBIN A1C: HEMOGLOBIN A1C: 6.1 % (ref 4.6–6.5)

## 2014-02-24 LAB — LDL CHOLESTEROL, DIRECT: Direct LDL: 210 mg/dL

## 2014-02-24 MED ORDER — ROSUVASTATIN CALCIUM 10 MG PO TABS: 10.0000 mg | ORAL_TABLET | Freq: Every day | ORAL | Status: DC

## 2014-02-24 MED ORDER — TRIAMTERENE-HCTZ 37.5-25 MG PO CAPS: 1.0000 | ORAL_CAPSULE | ORAL | Status: DC

## 2014-02-24 MED ORDER — METOPROLOL TARTRATE 100 MG PO TABS: ORAL_TABLET | ORAL | Status: DC

## 2014-02-24 MED ORDER — OLMESARTAN MEDOXOMIL 20 MG PO TABS: ORAL_TABLET | ORAL | Status: DC

## 2014-02-24 MED ORDER — ESZOPICLONE 2 MG PO TABS: 2.0000 mg | ORAL_TABLET | Freq: Every evening | ORAL | Status: DC | PRN

## 2014-02-24 MED ORDER — CLONIDINE HCL 0.2 MG PO TABS: 0.2000 mg | ORAL_TABLET | Freq: Every day | ORAL | Status: DC

## 2014-02-24 NOTE — Assessment & Plan Note (Addendum)
Currently taking melatonin and Benadryl, not completely satisfied w/ results, in the past ambien caused  headache. Plan: Trial with Lunesta (no need for UDS)

## 2014-02-24 NOTE — Assessment & Plan Note (Signed)
Good compliance with Crestor, refill provided, check labs

## 2014-02-24 NOTE — Assessment & Plan Note (Signed)
Has seen gynecology within the last year, has a mammogram scheduled for today. Flu shot provided

## 2014-02-24 NOTE — Assessment & Plan Note (Addendum)
diet and exercise discussed, check A1c

## 2014-02-24 NOTE — Patient Instructions (Signed)
Get your blood work before you leave   Check the  blood pressure 2 or 3 times a month   Be sure your blood pressure is between  145/85  and 110/65.  if it is consistently higher or lower, let me know    Please come back to the office in 6 months for a physical exam.

## 2014-02-24 NOTE — Progress Notes (Signed)
Subjective:    Patient ID: Amy NajjarMercedes Owens, female    DOB: 05-22-1955, 58 y.o.   MRN: [id number]  DOS:  02/24/2014 Type of visit - description : rov, here w/ daughter Interval history: High cholesterol, good medication compliance, no apparent side effects Hypertension, good medication compliance, BP today slightly elevated, not ambulatory BPs Prediabetes, not doing well with diet and exercise lately. Insomnia, currently on Benadryl and melatonin,  change meds ?   ROS Denies chest pain or difficulty breathing No nausea, vomiting, diarrhea No recent eye check (recommend to do)  Past Medical History  Diagnosis Date  . Chest pain     atypical, neg stress test 2009  . DM (diabetes mellitus)     borderline   . Migraine headache   . Hypertension   . Hyperlipidemia   . Perimenopausal   . Anxiety   . Insomnia     sleep disorder unspec    Past Surgical History  Procedure Laterality Date  . Colonoscopy w/ polypectomy  2006  . Knee arthroscopy  2007  . Tubal ligation    . Cesarean section      History   Social History  . Marital Status: Married    Spouse Name: N/A    Number of Children: N/A  . Years of Education: N/A   Occupational History  . Not on file.   Social History Main Topics  . Smoking status: Never Smoker   . Smokeless tobacco: Not on file  . Alcohol Use: No  . Drug Use: No  . Sexual Activity: Not on file   Other Topics Concern  . Not on file   Social History Narrative   Original from [place]. Regular exercise- none.         Medication List       This list is accurate as of: 02/24/14  6:56 PM.  Always use your most recent med list.               aspirin 81 MG chewable tablet  Commonly known as:  BABY ASPIRIN  Chew 1 tablet (81 mg total) by mouth daily.     cloNIDine 0.2 MG tablet  Commonly known as:  CATAPRES  Take 1 tablet (0.2 mg total) by mouth daily.     eszopiclone 2 MG Tabs tablet  Commonly known as:  LUNESTA    Take 1 tablet (2 mg total) by mouth at bedtime as needed for sleep. Take immediately before bedtime     metoprolol 100 MG tablet  Commonly known as:  LOPRESSOR  1 tablet in the morning and 1/2 tablet in the afternoon.     olmesartan 20 MG tablet  Commonly known as:  BENICAR  TAKE 1 TABLET BY MOUTH EVERY DAY     rosuvastatin 10 MG tablet  Commonly known as:  CRESTOR  Take 1 tablet (10 mg total) by mouth daily.     triamterene-hydrochlorothiazide 37.5-25 MG per capsule  Commonly known as:  DYAZIDE  Take 1 each (1 capsule total) by mouth every morning.           Objective:   Physical Exam BP 156/84 mmHg  Pulse 96  Temp(Src) 98.6 F (37 C) (Oral)  Ht 4\' 11"  (1.499 m)  Wt 158 lb 8 oz (71.895 kg)  BMI 32.00 kg/m2  SpO2 90% General -- alert, well-developed, NAD.  Lungs -- normal respiratory effort, no intercostal retractions, no accessory muscle use, and normal breath sounds.  Heart-- normal rate, regular rhythm,  no murmur.  Extremities-- no pretibial edema bilaterally  Neurologic--  alert & oriented X3. Speech normal, gait appropriate for age, strength symmetric and appropriate for age.  Psych-- Cognition and judgment appear intact. Cooperative with normal attention span and concentration. No anxious or depressed appearing.        Assessment & Plan:   Multiple refills provided today

## 2014-02-24 NOTE — Progress Notes (Signed)
Pre visit review using our clinic review tool, if applicable. No additional management support is needed unless otherwise documented below in the visit note. 

## 2014-02-24 NOTE — Assessment & Plan Note (Addendum)
Continue clonodin, Lopressor, Benicar and Dyazide. Check a BMP. Recommend ambulatory BPs

## 2014-02-25 ENCOUNTER — Other Ambulatory Visit: Payer: Self-pay

## 2014-02-25 ENCOUNTER — Encounter: Payer: Self-pay | Admitting: Internal Medicine

## 2014-02-25 DIAGNOSIS — E785 Hyperlipidemia, unspecified: Secondary | ICD-10-CM

## 2014-02-25 MED ORDER — ROSUVASTATIN CALCIUM 10 MG PO TABS: 10.0000 mg | ORAL_TABLET | Freq: Every day | ORAL | Status: DC

## 2014-02-25 MED ORDER — CLONIDINE HCL 0.2 MG PO TABS: 0.2000 mg | ORAL_TABLET | Freq: Every day | ORAL | Status: DC

## 2014-03-06 ENCOUNTER — Encounter: Payer: Self-pay | Admitting: Internal Medicine

## 2014-03-10 ENCOUNTER — Telehealth: Payer: Self-pay

## 2014-03-10 DIAGNOSIS — E785 Hyperlipidemia, unspecified: Secondary | ICD-10-CM

## 2014-03-10 NOTE — Telephone Encounter (Signed)
-----   Message from Wanda PlumpJose E Paz, MD sent at 03/09/2014  5:38 PM EST ----- Regarding: Enter   future orders FLP, AST, ALT To be done in approximately 6 weeks dx hyperlipidemia

## 2014-03-10 NOTE — Telephone Encounter (Signed)
FLP, AST, ALT future ordered.

## 2014-04-06 ENCOUNTER — Encounter: Payer: Self-pay | Admitting: Internal Medicine

## 2014-04-07 ENCOUNTER — Telehealth: Payer: Self-pay

## 2014-04-07 MED ORDER — ESZOPICLONE 2 MG PO TABS: 2.0000 mg | ORAL_TABLET | Freq: Every evening | ORAL | Status: DC | PRN

## 2014-04-07 NOTE — Telephone Encounter (Signed)
-----   Message from Wanda PlumpJose E Paz, MD sent at 04/07/2014  9:07 AM EST ----- Regarding: ok RF Ok #30, 1 RF, see below   Please send a refill for [facility], [facility]MG  [facility] [address].

## 2014-04-07 NOTE — Telephone Encounter (Signed)
[facility] requested.

## 2014-04-22 ENCOUNTER — Other Ambulatory Visit: Payer: Self-pay

## 2014-04-22 ENCOUNTER — Other Ambulatory Visit (INDEPENDENT_AMBULATORY_CARE_PROVIDER_SITE_OTHER): Payer: BLUE CROSS/BLUE SHIELD

## 2014-04-22 DIAGNOSIS — E785 Hyperlipidemia, unspecified: Secondary | ICD-10-CM

## 2014-04-22 LAB — LIPID PANEL
Cholesterol: 146 mg/dL (ref 0–200)
HDL: 45.9 mg/dL (ref >39.00)
LDL CALC: 77 mg/dL (ref 0–99)
NONHDL: 100.1
Total CHOL/HDL Ratio: 3
Triglycerides: 115 mg/dL (ref 0.0–149.0)
VLDL: 23 mg/dL (ref 0.0–40.0)

## 2014-04-22 LAB — ALT: ALT: 23 U/L (ref 0–35)

## 2014-04-22 LAB — AST: AST: 19 U/L (ref 0–37)

## 2014-07-06 ENCOUNTER — Other Ambulatory Visit: Payer: Self-pay | Admitting: Internal Medicine

## 2014-07-06 DIAGNOSIS — R921 Mammographic calcification found on diagnostic imaging of breast: Secondary | ICD-10-CM

## 2014-07-09 ENCOUNTER — Ambulatory Visit
Admission: RE | Admit: 2014-07-09 | Discharge: 2014-07-09 | Disposition: A | Discharge status: Home / Self Care | Payer: BLUE CROSS/BLUE SHIELD | Source: Ambulatory Visit | Attending: Internal Medicine | Admitting: Internal Medicine

## 2014-07-09 DIAGNOSIS — R921 Mammographic calcification found on diagnostic imaging of breast: Secondary | ICD-10-CM

## 2014-07-31 ENCOUNTER — Other Ambulatory Visit: Payer: Self-pay

## 2014-07-31 ENCOUNTER — Encounter: Payer: Self-pay | Admitting: Internal Medicine

## 2014-07-31 MED ORDER — ESZOPICLONE 2 MG PO TABS: 2.0000 mg | ORAL_TABLET | Freq: Every evening | ORAL | Status: DC | PRN

## 2014-07-31 NOTE — Telephone Encounter (Signed)
[facility]

## 2014-12-24 ENCOUNTER — Other Ambulatory Visit: Payer: Self-pay | Admitting: Internal Medicine

## 2014-12-24 DIAGNOSIS — E785 Hyperlipidemia, unspecified: Secondary | ICD-10-CM

## 2014-12-25 ENCOUNTER — Telehealth: Payer: Self-pay | Admitting: Internal Medicine

## 2014-12-25 DIAGNOSIS — I10 Essential (primary) hypertension: Secondary | ICD-10-CM

## 2014-12-25 DIAGNOSIS — E785 Hyperlipidemia, unspecified: Secondary | ICD-10-CM

## 2014-12-25 MED ORDER — CLONIDINE HCL 0.2 MG PO TABS: 0.2000 mg | ORAL_TABLET | Freq: Every day | ORAL | Status: DC

## 2014-12-25 MED ORDER — ROSUVASTATIN CALCIUM 10 MG PO TABS: 10.0000 mg | ORAL_TABLET | Freq: Every day | ORAL | Status: DC

## 2014-12-25 MED ORDER — TRIAMTERENE-HCTZ 37.5-25 MG PO CAPS: 1.0000 | ORAL_CAPSULE | ORAL | Status: DC

## 2014-12-25 MED ORDER — METOPROLOL TARTRATE 100 MG PO TABS: ORAL_TABLET | ORAL | Status: DC

## 2014-12-25 MED ORDER — OLMESARTAN MEDOXOMIL 20 MG PO TABS: 20.0000 mg | ORAL_TABLET | Freq: Every day | ORAL | Status: DC

## 2014-12-25 NOTE — Telephone Encounter (Signed)
Caller name: Tobi Bastosnna  Relation to pt: daughter  Call back number: [phone] [id number] - [address], [place] - [address] [facility] [address] [facility] [address] [phone] (Phone) [phone])         Reason for call:  Tobi Bastosnna states her mother needs all medication. Please follow up with daughter directly if you have any questions or concerns

## 2014-12-25 NOTE — Telephone Encounter (Signed)
Noted  

## 2014-12-25 NOTE — Telephone Encounter (Signed)
Rx's sent. 90 day supply and 0 RF until seen.

## 2014-12-25 NOTE — Telephone Encounter (Signed)
Daughter called back and stated [facility] no longer exist please send all Rx to retail. Thank you

## 2014-12-25 NOTE — Telephone Encounter (Signed)
Caller name: Dava Najjarorres-Fernandez, [place] Relation to ZO:XWRUpt:self Call back number: [phone] Pharmacy   Reason for call:  Patient scheduled physical appointment for 02/05/15 and will run out of medication but does not know the name of medication, will call back with information

## 2014-12-25 NOTE — Telephone Encounter (Signed)
Daughter called back to state all medication needs to go to future mail order with the exception of Crestor. Daughter states she does apologize patient is given her "bits of pieces of information."

## 2015-01-08 ENCOUNTER — Ambulatory Visit: Payer: BLUE CROSS/BLUE SHIELD | Admitting: Internal Medicine

## 2015-02-05 ENCOUNTER — Encounter: Payer: Self-pay | Admitting: Internal Medicine

## 2015-02-05 ENCOUNTER — Ambulatory Visit (INDEPENDENT_AMBULATORY_CARE_PROVIDER_SITE_OTHER): Payer: BLUE CROSS/BLUE SHIELD | Admitting: Internal Medicine

## 2015-02-05 VITALS — BP 122/76 | HR 60 | Temp 98.0°F | Ht 59.5 in | Wt 159.0 lb

## 2015-02-05 DIAGNOSIS — F329 Major depressive disorder, single episode, unspecified: Secondary | ICD-10-CM | POA: Diagnosis not present

## 2015-02-05 DIAGNOSIS — Z09 Encounter for follow-up examination after completed treatment for conditions other than malignant neoplasm: Secondary | ICD-10-CM

## 2015-02-05 DIAGNOSIS — Z114 Encounter for screening for human immunodeficiency virus [HIV]: Secondary | ICD-10-CM

## 2015-02-05 DIAGNOSIS — R7303 Prediabetes: Secondary | ICD-10-CM

## 2015-02-05 DIAGNOSIS — R5383 Other fatigue: Secondary | ICD-10-CM

## 2015-02-05 DIAGNOSIS — F32A Depression, unspecified: Secondary | ICD-10-CM

## 2015-02-05 DIAGNOSIS — Z23 Encounter for immunization: Secondary | ICD-10-CM

## 2015-02-05 DIAGNOSIS — Z0000 Encounter for general adult medical examination without abnormal findings: Secondary | ICD-10-CM

## 2015-02-05 DIAGNOSIS — Z1159 Encounter for screening for other viral diseases: Secondary | ICD-10-CM

## 2015-02-05 LAB — CBC WITH DIFFERENTIAL/PLATELET
BASOS PCT: 0.6 % (ref 0.0–3.0)
Basophils Absolute: 0 10*3/uL (ref 0.0–0.1)
EOS ABS: 0.2 10*3/uL (ref 0.0–0.7)
EOS PCT: 2.7 % (ref 0.0–5.0)
HCT: 42.3 % (ref 36.0–46.0)
HEMOGLOBIN: 14 g/dL (ref 12.0–15.0)
LYMPHS ABS: 2 10*3/uL (ref 0.7–4.0)
Lymphocytes Relative: 27.5 % (ref 12.0–46.0)
MCHC: 33.2 g/dL (ref 30.0–36.0)
MCV: 92.3 fl (ref 78.0–100.0)
MONO ABS: 0.4 10*3/uL (ref 0.1–1.0)
Monocytes Relative: 6.2 % (ref 3.0–12.0)
NEUTROS PCT: 63 % (ref 43.0–77.0)
Neutro Abs: 4.5 10*3/uL (ref 1.4–7.7)
Platelets: 185 10*3/uL (ref 150.0–400.0)
RBC: 4.58 Mil/uL (ref 3.87–5.11)
RDW: 12.8 % (ref 11.5–15.5)
WBC: 7.2 10*3/uL (ref 4.0–10.5)

## 2015-02-05 LAB — COMPREHENSIVE METABOLIC PANEL
ALBUMIN: 4.9 g/dL (ref 3.5–5.2)
ALK PHOS: 60 U/L (ref 39–117)
ALT: 24 U/L (ref 0–35)
AST: 21 U/L (ref 0–37)
BUN: 26 mg/dL — AB (ref 6–23)
CO2: 28 mEq/L (ref 19–32)
CREATININE: 0.99 mg/dL (ref 0.40–1.20)
Calcium: 10.2 mg/dL (ref 8.4–10.5)
Chloride: 104 mEq/L (ref 96–112)
GFR: 60.57 mL/min (ref >60.00)
Glucose, Bld: 102 mg/dL — ABNORMAL HIGH (ref 70–99)
POTASSIUM: 3.6 meq/L (ref 3.5–5.1)
SODIUM: 142 meq/L (ref 135–145)
TOTAL PROTEIN: 7.8 g/dL (ref 6.0–8.3)
Total Bilirubin: 0.5 mg/dL (ref 0.2–1.2)

## 2015-02-05 LAB — VITAMIN B12: Vitamin B-12: 347 pg/mL (ref 211–911)

## 2015-02-05 LAB — HEMOGLOBIN A1C: HEMOGLOBIN A1C: 5.8 % (ref 4.6–6.5)

## 2015-02-05 LAB — FOLATE: Folate: 18.1 ng/mL (ref >5.9)

## 2015-02-05 LAB — TSH: TSH: 1.9 u[IU]/mL (ref 0.35–4.50)

## 2015-02-05 MED ORDER — OMEPRAZOLE 40 MG PO CPDR: 40.0000 mg | DELAYED_RELEASE_CAPSULE | Freq: Every day | ORAL | Status: DC

## 2015-02-05 MED ORDER — FLUOXETINE HCL 20 MG PO TABS: 20.0000 mg | ORAL_TABLET | Freq: Every day | ORAL | Status: DC

## 2015-02-05 NOTE — Progress Notes (Signed)
Subjective:    Patient ID: Amy Owens, female    DOB: Jul 24, 1955, 59 y.o.   MRN: [id number]  DOS:  02/05/2015 Type of visit - description : CPX, here with her daughter Interval history: States she is doing well but on further questioning she reports  several months history of decreased energy, "I don't like to do anything". She admits to some depression. Her husband works out of state, her 2 children are out in college. No crying spells. Also reports on and off cough, 4 months, denies postnasal dripping but has heartburn on and off, sometimes severe. Insomnia not completely well-controlled with Lunesta    Review of Systems Constitutional: No fever. No chills. No unexplained wt changes. No unusual sweats  HEENT: No dental problems, no ear discharge, no facial swelling, no voice changes. No eye discharge, no eye  redness , no  intolerance to light   Respiratory: No wheezing , no  difficulty breathing.   Cardiovascular: No CP, no leg swelling , no  Palpitations  GI: no nausea, no vomiting, no diarrhea , no  abdominal pain.  No blood in the stools. No dysphagia, no odynophagia    Endocrine: No polyphagia, no polyuria , no polydipsia  GU: No dysuria, gross hematuria, difficulty urinating. No urinary urgency, no frequency.  Musculoskeletal: No joint swellings or unusual aches or pains  Skin: No change in the color of the skin, palor , no  Rash  Allergic, immunologic: No environmental allergies , no  food allergies  Neurological: No dizziness no  syncope. No headaches. No diplopia, no slurred, no slurred speech, no motor deficits, no facial  Numbness  Hematological: No enlarged lymph nodes, no easy bruising , no unusual bleedings  Psychiatry: No suicidal ideas, no hallucinations, no beavior problems, no confusion.     Past Medical History  Diagnosis Date  . Chest pain     atypical, neg stress test 2009  . DM (diabetes mellitus) (HCC)     borderline   .  Migraine headache   . Hypertension   . Hyperlipidemia   . Perimenopausal   . Anxiety   . Insomnia     sleep disorder unspec    Past Surgical History  Procedure Laterality Date  . Colonoscopy w/ polypectomy  2006  . Knee arthroscopy  2007  . Tubal ligation    . Cesarean section      Social History   Social History  . Marital Status: Married    Spouse Name: N/A  . Number of Children: 2  . Years of Education: N/A   Occupational History  . stay home     Social History Main Topics  . Smoking status: Never Smoker   . Smokeless tobacco: Not on file  . Alcohol Use: No  . Drug Use: No  . Sexual Activity: Not on file   Other Topics Concern  . Not on file   Social History Narrative   [place].    2 kids in college.      Family History  Problem Relation Age of Onset  . Coronary artery disease Mother     3750s  . Diabetes Mother   . Coronary artery disease Father     8140s  . Diabetes Sister   . Diabetes Brother   . Diabetes Brother   . Colon cancer Neg Hx   . Breast cancer Neg Hx        Medication List       This list  is accurate as of: 02/05/15 11:59 PM.  Always use your most recent med list.               aspirin 81 MG chewable tablet  Commonly known as:  BABY ASPIRIN  Chew 1 tablet (81 mg total) by mouth daily.     cloNIDine 0.2 MG tablet  Commonly known as:  CATAPRES  Take 1 tablet (0.2 mg total) by mouth daily.     eszopiclone 2 MG Tabs tablet  Commonly known as:  LUNESTA  Take 1 tablet (2 mg total) by mouth at bedtime as needed for sleep. Take immediately before bedtime     FLUoxetine 20 MG tablet  Commonly known as:  PROZAC  Take 1 tablet (20 mg total) by mouth daily.     metoprolol 100 MG tablet  Commonly known as:  LOPRESSOR  Take 1 tablet by mouth in the morning and 1/2 tablet by mouth in the afternoon.     olmesartan 20 MG tablet  Commonly known as:  BENICAR  Take 1 tablet (20 mg total) by mouth daily.      omeprazole 40 MG capsule  Commonly known as:  PRILOSEC  Take 1 capsule (40 mg total) by mouth daily.     rosuvastatin 10 MG tablet  Commonly known as:  CRESTOR  Take 1 tablet (10 mg total) by mouth daily.     triamterene-hydrochlorothiazide 37.5-25 MG capsule  Commonly known as:  DYAZIDE  Take 1 each (1 capsule total) by mouth every morning.           Objective:   Physical Exam BP 122/76 mmHg  Pulse 60  Temp(Src) 98 F (36.7 C) (Oral)  Ht 4' 11.5" (1.511 m)  Wt 159 lb (72.122 kg)  BMI 31.59 kg/m2  SpO2 92% General:   Well developed, well nourished . NAD.  Neck:  No  thyromegaly , normal carotid pulse HEENT:  Normocephalic . Face symmetric, atraumatic Lungs:  CTA B Normal respiratory effort, no intercostal retractions, no accessory muscle use. Heart: RRR,  no murmur.  No pretibial edema bilaterally  Abdomen:  Not distended, soft, non-tender. No rebound or rigidity.   Skin: Exposed areas without rash. Not pale. Not jaundice Neurologic:  alert & oriented X3.  Speech normal, gait appropriate for age and unassisted Strength symmetric and appropriate for age.  Psych: Cognition and judgment appear intact.  Cooperative with normal attention span and concentration.  Behavior appropriate. No anxious appearing, slightly depressed?    Assessment & Plan:   Assessment > prediabetes  HTN Hyperlipidemia anxiety, insomnia H/o  migraines H/o CP (-) stress test 2009 +FH CAD, DM   PLAN Prediabetes: Diet and exercise discussed. Check A1c HTN: Seems well-controlled Depression: PHQ 9 #13 . Treatment options discussed, including counseling , medication. We agreed to start Prozac, s/e discussed. Fatigue: Checking labs. Reassess once depression is treated Cough: + Heartburn. Recommend omeprazole daily x 6 weeks. RTC 2 months

## 2015-02-05 NOTE — Assessment & Plan Note (Addendum)
Td 2014 Flu shot today zostavax --2014 Saw Gyn ~ 2015, rec to see them again MMG 06-2013 (-) Cscope 2006: tubular adenomas, Cscope ahgain 06-2011, 1 polyp, not recovered, next 5 years per report Dr Loreta AveMann  EKG sinus bradycardia. No acute changes Diet- ok Exercise- nothing regular, counseled

## 2015-02-05 NOTE — Patient Instructions (Addendum)
Get your blood work before you leave   please see your gynecology yearly  Start taking fluoxetine 20 mg: The first 2 weeks take only half tablet daily, then take 1 tablet every day  Start taking omeprazole 1 tablet before breakfast for 6 weeks.     Next visit  for a checkup in 6 weeks. No fasting.     Please schedule an appointment at the front desk

## 2015-02-05 NOTE — Progress Notes (Signed)
Pre visit review using our clinic review tool, if applicable. No additional management support is needed unless otherwise documented below in the visit note. 

## 2015-02-06 LAB — HIV ANTIBODY (ROUTINE TESTING W REFLEX): HIV 1&2 Ab, 4th Generation: NONREACTIVE

## 2015-02-06 LAB — HEPATITIS C ANTIBODY: HCV Ab: NEGATIVE

## 2015-02-07 DIAGNOSIS — Z09 Encounter for follow-up examination after completed treatment for conditions other than malignant neoplasm: Secondary | ICD-10-CM | POA: Insufficient documentation

## 2015-02-07 NOTE — Assessment & Plan Note (Signed)
Prediabetes: Diet and exercise discussed. Check A1c HTN: Seems well-controlled Depression: PHQ 9 #13 . Treatment options discussed, including counseling , medication. We agreed to start Prozac, s/e discussed. Fatigue: Checking labs. Reassess once depression is treated Cough: + Heartburn. Recommend omeprazole daily x 6 weeks. RTC 2 months

## 2015-03-19 ENCOUNTER — Ambulatory Visit (INDEPENDENT_AMBULATORY_CARE_PROVIDER_SITE_OTHER): Payer: BLUE CROSS/BLUE SHIELD | Admitting: Internal Medicine

## 2015-03-19 ENCOUNTER — Encounter: Payer: Self-pay | Admitting: Internal Medicine

## 2015-03-19 VITALS — BP 108/66 | HR 48 | Temp 98.4°F | Ht 60.0 in | Wt 159.0 lb

## 2015-03-19 DIAGNOSIS — G4700 Insomnia, unspecified: Secondary | ICD-10-CM | POA: Diagnosis not present

## 2015-03-19 DIAGNOSIS — F329 Major depressive disorder, single episode, unspecified: Secondary | ICD-10-CM

## 2015-03-19 DIAGNOSIS — F32A Depression, unspecified: Secondary | ICD-10-CM

## 2015-03-19 MED ORDER — FLUOXETINE HCL 20 MG PO TABS: 30.0000 mg | ORAL_TABLET | Freq: Every day | ORAL | Status: DC

## 2015-03-19 MED ORDER — ESZOPICLONE 3 MG PO TABS: 3.0000 mg | ORAL_TABLET | Freq: Every day | ORAL | Status: DC

## 2015-03-19 NOTE — Patient Instructions (Signed)
BEFORE YOU LEAVE THE OFFICE:  GO TO THE FRONT DESK  Schedule a routine office visit or check up to be done in  4 months  No  fasting

## 2015-03-19 NOTE — Progress Notes (Signed)
Subjective:    Patient ID: Amy Owens, female    DOB: 06-Apr-1955, 60 y.o.   MRN: [id number]  DOS:  03/19/2015 Type of visit - description : routine visit, here with her daughter Interval history: Started Prozac, for depression. Feeling better. Energy has increased, sleeping better but not completely satisfied w/ results   Review of Systems  Reports mild nausea since she started Prozac, not everyday, not bothersome. No vomiting or diarrhea. No suicidal ideas.  Past Medical History  Diagnosis Date  . Chest pain     atypical, neg stress test 2009  . DM (diabetes mellitus) (HCC)     borderline   . Migraine headache   . Hypertension   . Hyperlipidemia   . Perimenopausal   . Anxiety   . Insomnia     sleep disorder unspec    Past Surgical History  Procedure Laterality Date  . Colonoscopy w/ polypectomy  2006  . Knee arthroscopy  2007  . Tubal ligation    . Cesarean section      Social History   Social History  . Marital Status: Married    Spouse Name: N/A  . Number of Children: 2  . Years of Education: N/A   Occupational History  . stay home     Social History Main Topics  . Smoking status: Never Smoker   . Smokeless tobacco: Not on file  . Alcohol Use: No  . Drug Use: No  . Sexual Activity: Not on file   Other Topics Concern  . Not on file   Social History Narrative   [place] from [place]    2 kids in college.         Medication List       This list is accurate as of: 03/19/15 11:59 PM.  Always use your most recent med list.               aspirin 81 MG chewable tablet  Commonly known as:  BABY ASPIRIN  Chew 1 tablet (81 mg total) by mouth daily.     cloNIDine 0.2 MG tablet  Commonly known as:  CATAPRES  Take 1 tablet (0.2 mg total) by mouth daily.     Eszopiclone 3 MG Tabs  Commonly known as:  eszopiclone  Take 1 tablet (3 mg total) by mouth at bedtime. Take immediately before bedtime     FLUoxetine 20 MG tablet    Commonly known as:  PROZAC  Take 1.5 tablets (30 mg total) by mouth daily.     metoprolol 100 MG tablet  Commonly known as:  LOPRESSOR  Take 1 tablet by mouth in the morning and 1/2 tablet by mouth in the afternoon.     olmesartan 20 MG tablet  Commonly known as:  BENICAR  Take 1 tablet (20 mg total) by mouth daily.     omeprazole 40 MG capsule  Commonly known as:  PRILOSEC  Take 1 capsule (40 mg total) by mouth daily.     rosuvastatin 10 MG tablet  Commonly known as:  CRESTOR  Take 1 tablet (10 mg total) by mouth daily.     triamterene-hydrochlorothiazide 37.5-25 MG capsule  Commonly known as:  DYAZIDE  Take 1 each (1 capsule total) by mouth every morning.           Objective:   Physical Exam BP 108/66 mmHg  Pulse 48  Temp(Src) 98.4 F (36.9 C) (Oral)  Ht 5' (1.524 m)  Wt 159 lb (72.122 kg)  BMI 31.05 kg/m2  SpO2 98% General:   Well developed, well nourished . NAD.  HEENT:  Normocephalic . Face symmetric, atraumatic Skin: Not pale. Not jaundice Neurologic:  alert & oriented X3.  Speech normal, gait appropriate for age and unassisted Psych--  Cognition and judgment appear intact.  Cooperative with normal attention span and concentration.  Behavior appropriate. Seems better, in good spirits     Assessment & Plan:   Assessment > prediabetes  HTN Hyperlipidemia anxiety, insomnia-- Intolerant to [employer] 2014 (HAs) rx prozac 01-2015, good results H/o  migraines H/o CP (-) stress test 2009 +FH CAD, DM   PLAN Depression: Started Prozac, good response, room for improvment. She is doing some counseling. We agreed on increase fluoxetine to 30 mg daily and come back in 6 months. Encourage physical activity and get more engaged with family and community. Insomnia: on Lunesta 2 mg, room for improvement, increased to 3 mg. Consider add prn OTC melatonin. RTC 6 months

## 2015-03-19 NOTE — Progress Notes (Signed)
Pre visit review using our clinic review tool, if applicable. No additional management support is needed unless otherwise documented below in the visit note. 

## 2015-03-23 ENCOUNTER — Other Ambulatory Visit: Payer: Self-pay | Admitting: Internal Medicine

## 2015-05-25 ENCOUNTER — Other Ambulatory Visit: Payer: Self-pay | Admitting: Internal Medicine

## 2015-06-01 ENCOUNTER — Encounter: Payer: Self-pay | Admitting: Internal Medicine

## 2015-07-22 ENCOUNTER — Other Ambulatory Visit: Payer: Self-pay | Admitting: Internal Medicine

## 2015-08-27 ENCOUNTER — Other Ambulatory Visit: Payer: Self-pay | Admitting: Internal Medicine

## 2015-09-01 ENCOUNTER — Telehealth: Payer: Self-pay

## 2015-09-01 MED ORDER — ESZOPICLONE 3 MG PO TABS: 3.0000 mg | ORAL_TABLET | Freq: Every day | ORAL | Status: DC

## 2015-09-01 NOTE — Telephone Encounter (Signed)
Pt is requesting refill on Lunesta. Dr. Leta JunglingPaz's Pt.  Last OV: 03/19/2015 Last Fill: 03/19/2015 #30 and 3RF UDS: Not needed for Lunesta  Please advise.

## 2015-09-01 NOTE — Telephone Encounter (Signed)
Filling lunesta  For pt since Dr. Drue NovelPaz is out.

## 2015-09-01 NOTE — Telephone Encounter (Signed)
Rx faxed to [facility] pharmacy.  

## 2015-09-16 ENCOUNTER — Ambulatory Visit: Payer: BLUE CROSS/BLUE SHIELD | Admitting: Internal Medicine

## 2015-12-20 ENCOUNTER — Other Ambulatory Visit: Payer: Self-pay | Admitting: Internal Medicine

## 2015-12-22 ENCOUNTER — Other Ambulatory Visit: Payer: Self-pay | Admitting: Internal Medicine

## 2016-01-17 ENCOUNTER — Other Ambulatory Visit: Payer: Self-pay | Admitting: Internal Medicine

## 2016-01-25 ENCOUNTER — Other Ambulatory Visit: Payer: Self-pay | Admitting: Internal Medicine

## 2016-01-25 DIAGNOSIS — E785 Hyperlipidemia, unspecified: Secondary | ICD-10-CM

## 2016-01-25 MED ORDER — CLONIDINE HCL 0.2 MG PO TABS: 0.2000 mg | ORAL_TABLET | Freq: Every day | ORAL | 0 refills | Status: DC

## 2016-01-25 NOTE — Telephone Encounter (Signed)
Last OV 01/2015. 30 day supply was sent on 12/21/2015. Will send 15 day supply until visit. Pt informed to call office to schedule appt for further refills.

## 2016-01-26 ENCOUNTER — Telehealth: Payer: Self-pay | Admitting: Internal Medicine

## 2016-01-26 NOTE — Telephone Encounter (Signed)
Pt has not been seen since 01/2015. Can she not be seen sooner before medications run out?

## 2016-01-26 NOTE — Telephone Encounter (Signed)
Caller name: Arroyo,Jose A Relationship to patient: Husband Can be reached: [phone] [facility]  [facility] [id number] - [place], [place] - [address] [facility] [address] [facility] [phone] (Phone) [phone] (Fax)    Reason for call: Request more cloNIDine (CATAPRES) 0.2 MG tablet [[id number] and rosuvastatin (CRESTOR) 10 MG tablet [[id number]  States patient was only given 15 tablets and she needs more to get her through her trip day.

## 2016-02-14 ENCOUNTER — Ambulatory Visit (INDEPENDENT_AMBULATORY_CARE_PROVIDER_SITE_OTHER): Payer: BLUE CROSS/BLUE SHIELD | Admitting: Internal Medicine

## 2016-02-14 ENCOUNTER — Encounter: Payer: Self-pay | Admitting: Internal Medicine

## 2016-02-14 VITALS — BP 122/70 | HR 50 | Temp 98.6°F | Resp 12 | Ht 60.0 in | Wt 159.5 lb

## 2016-02-14 DIAGNOSIS — F411 Generalized anxiety disorder: Secondary | ICD-10-CM | POA: Diagnosis not present

## 2016-02-14 DIAGNOSIS — Z23 Encounter for immunization: Secondary | ICD-10-CM

## 2016-02-14 DIAGNOSIS — E785 Hyperlipidemia, unspecified: Secondary | ICD-10-CM

## 2016-02-14 DIAGNOSIS — I10 Essential (primary) hypertension: Secondary | ICD-10-CM | POA: Diagnosis not present

## 2016-02-14 DIAGNOSIS — G4700 Insomnia, unspecified: Secondary | ICD-10-CM | POA: Diagnosis not present

## 2016-02-14 LAB — BASIC METABOLIC PANEL
BUN: 23 mg/dL (ref 6–23)
CHLORIDE: 104 meq/L (ref 96–112)
CO2: 27 mEq/L (ref 19–32)
CREATININE: 0.9 mg/dL (ref 0.40–1.20)
Calcium: 9.5 mg/dL (ref 8.4–10.5)
GFR: 67.38 mL/min (ref >60.00)
Glucose, Bld: 104 mg/dL — ABNORMAL HIGH (ref 70–99)
POTASSIUM: 4.3 meq/L (ref 3.5–5.1)
Sodium: 142 mEq/L (ref 135–145)

## 2016-02-14 MED ORDER — METOPROLOL TARTRATE 100 MG PO TABS: ORAL_TABLET | ORAL | 1 refills | Status: DC

## 2016-02-14 MED ORDER — TRIAMTERENE-HCTZ 37.5-25 MG PO CAPS: 1.0000 | ORAL_CAPSULE | Freq: Every morning | ORAL | 1 refills | Status: DC

## 2016-02-14 MED ORDER — ROSUVASTATIN CALCIUM 10 MG PO TABS: 10.0000 mg | ORAL_TABLET | Freq: Every day | ORAL | 1 refills | Status: DC

## 2016-02-14 MED ORDER — ESZOPICLONE 3 MG PO TABS: 3.0000 mg | ORAL_TABLET | Freq: Every day | ORAL | 1 refills | Status: DC

## 2016-02-14 MED ORDER — OMEPRAZOLE 40 MG PO CPDR: 40.0000 mg | DELAYED_RELEASE_CAPSULE | Freq: Every day | ORAL | 1 refills | Status: DC

## 2016-02-14 MED ORDER — OLMESARTAN MEDOXOMIL 20 MG PO TABS: 20.0000 mg | ORAL_TABLET | Freq: Every day | ORAL | 1 refills | Status: DC

## 2016-02-14 MED ORDER — FLUOXETINE HCL 20 MG PO TABS: 30.0000 mg | ORAL_TABLET | Freq: Every day | ORAL | 1 refills | Status: DC

## 2016-02-14 MED ORDER — CLONIDINE HCL 0.2 MG PO TABS: 0.2000 mg | ORAL_TABLET | Freq: Every day | ORAL | 1 refills | Status: DC

## 2016-02-14 NOTE — Progress Notes (Signed)
Eszopiclone Rx faxed to [facility].

## 2016-02-14 NOTE — Patient Instructions (Signed)
GO TO THE LAB : Get the blood work     GO TO THE FRONT DESK Schedule your next appointment for a  Physical exam by 05-2016   

## 2016-02-14 NOTE — Progress Notes (Signed)
Subjective:    Patient ID: Amy Owens, female    DOB: October 20, 1955, 60 y.o.   MRN: [id number]  DOS:  02/14/2016 Type of visit - description : And office visit  Interval history: HTN: Good medication compliance except for Klonopin, she ran out a few days ago. High cholesterol: Run out of statins few days ago, needs a refill. Anxiety, insomnia: Feel well most days, using Prozac as needed only.    Review of Systems Denies chest pain or difficulty breathing. Has occasional neck pain, no radiation, worse with certain neck movements. No paresthesias .  Past Medical History:  Diagnosis Date  . Anxiety   . Chest pain    atypical, neg stress test 2009  . DM (diabetes mellitus) (HCC)    borderline   . Hyperlipidemia   . Hypertension   . Insomnia    sleep disorder unspec  . Migraine headache   . Perimenopausal     Past Surgical History:  Procedure Laterality Date  . CESAREAN SECTION    . COLONOSCOPY W/ POLYPECTOMY  2006  . KNEE ARTHROSCOPY  2007  . TUBAL LIGATION      Social History   Social History  . Marital status: Married    Spouse name: N/A  . Number of children: 2  . Years of education: N/A   Occupational History  . stay home     Social History Main Topics  . Smoking status: Never Smoker  . Smokeless tobacco: Not on file  . Alcohol use No  . Drug use: No  . Sexual activity: Not on file   Other Topics Concern  . Not on file   Social History Narrative   [place] from [place].    2 kids in college.       Allergies as of 02/14/2016   No Known Allergies     Medication List       Accurate as of 02/14/16 11:59 PM. Always use your most recent med list.          aspirin 81 MG chewable tablet Commonly known as:  BABY ASPIRIN Chew 1 tablet (81 mg total) by mouth daily.   cloNIDine 0.2 MG tablet Commonly known as:  CATAPRES Take 1 tablet (0.2 mg total) by mouth daily.   Eszopiclone 3 MG Tabs Commonly known as:   eszopiclone Take 1 tablet (3 mg total) by mouth at bedtime. Take immediately before bedtime   FLUoxetine 20 MG tablet Commonly known as:  PROZAC Take 1.5 tablets (30 mg total) by mouth daily.   metoprolol 100 MG tablet Commonly known as:  LOPRESSOR Take 1 tablet by mouth every morning and 1/2 tablet by mouth every evening.   olmesartan 20 MG tablet Commonly known as:  BENICAR Take 1 tablet (20 mg total) by mouth daily.   omeprazole 40 MG capsule Commonly known as:  PRILOSEC Take 1 capsule (40 mg total) by mouth daily.   rosuvastatin 10 MG tablet Commonly known as:  CRESTOR Take 1 tablet (10 mg total) by mouth daily.   triamterene-hydrochlorothiazide 37.5-25 MG capsule Commonly known as:  DYAZIDE Take 1 each (1 capsule total) by mouth every morning.          Objective:   Physical Exam BP 122/70 (BP Location: Left Arm, Patient Position: Sitting, Cuff Size: Small)   Pulse (!) 50   Temp 98.6 F (37 C) (Oral)   Resp 12   Ht 5' (1.524 m)   Wt 159 lb 8 oz (72.3 kg)  SpO2 96%   BMI 31.15 kg/m  General:   Well developed, well nourished . NAD.  HEENT:  Normocephalic . Face symmetric, atraumatic Lungs:  CTA B Normal respiratory effort, no intercostal retractions, no accessory muscle use. Heart: RRR,  no murmur.  No pretibial edema bilaterally  Skin: Not pale. Not jaundice Neurologic:  alert & oriented X3.  Speech normal, gait appropriate for age and unassisted Psych--  Cognition and judgment appear intact.  Cooperative with normal attention span and concentration.  Behavior appropriate. No anxious or depressed appearing.      Assessment & Plan:    Assessment > prediabetes  HTN Hyperlipidemia anxiety, insomnia-- Intolerant to [employer] 2014 (HAs) rx prozac 01-2015, good results H/o  migraines H/o CP (-) stress test 2009 +FH CAD, DM   PLAN HTN: BP is very good despite she ran out of Catapres few days ago. Refill Benicar, Lopressor, Dyazide and Catapres.  Check a BMP Anxiety, insomnia, depression-on Prozac "as needed", recommend to take it daily, continue eszociplone, RF meds  High cholesterol: Refill Crestor. Labs when she comes back Has neck pain, plans to see his orthopedic doctor Flu shot today RTC 4 months, CPX

## 2016-02-14 NOTE — Progress Notes (Signed)
Pre visit review using our clinic review tool, if applicable. No additional management support is needed unless otherwise documented below in the visit note. 

## 2016-02-15 NOTE — Assessment & Plan Note (Signed)
HTN: BP is very good despite she ran out of Catapres few days ago. Refill Benicar, Lopressor, Dyazide and Catapres. Check a BMP Anxiety, insomnia, depression-on Prozac "as needed", recommend to take it daily, continue eszociplone, RF meds  High cholesterol: Refill Crestor. Labs when she comes back Has neck pain, plans to see his orthopedic doctor Flu shot today RTC 4 months, CPX

## 2016-03-27 ENCOUNTER — Other Ambulatory Visit: Payer: Self-pay | Admitting: Internal Medicine

## 2016-03-27 ENCOUNTER — Other Ambulatory Visit: Payer: Self-pay | Admitting: Family Medicine

## 2016-05-10 ENCOUNTER — Telehealth: Payer: Self-pay

## 2016-05-10 NOTE — Telephone Encounter (Signed)
PA initiated via Covermymeds; KEY: [id number]. Received real-time PA approval through 05/10/2017. [facility] and sent for scanning.

## 2016-06-14 ENCOUNTER — Other Ambulatory Visit: Payer: Self-pay

## 2016-06-15 ENCOUNTER — Encounter: Payer: Self-pay | Admitting: Internal Medicine

## 2016-06-15 ENCOUNTER — Ambulatory Visit (INDEPENDENT_AMBULATORY_CARE_PROVIDER_SITE_OTHER): Payer: BLUE CROSS/BLUE SHIELD | Admitting: Internal Medicine

## 2016-06-15 ENCOUNTER — Ambulatory Visit (HOSPITAL_BASED_OUTPATIENT_CLINIC_OR_DEPARTMENT_OTHER)
Admission: RE | Admit: 2016-06-15 | Discharge: 2016-06-15 | Disposition: A | Discharge status: Home / Self Care | Payer: BLUE CROSS/BLUE SHIELD | Source: Ambulatory Visit | Attending: Internal Medicine | Admitting: Internal Medicine

## 2016-06-15 VITALS — BP 116/74 | HR 53 | Temp 98.3°F | Resp 14 | Ht 60.0 in | Wt 148.0 lb

## 2016-06-15 DIAGNOSIS — M5030 Other cervical disc degeneration, unspecified cervical region: Secondary | ICD-10-CM | POA: Diagnosis not present

## 2016-06-15 DIAGNOSIS — M4802 Spinal stenosis, cervical region: Secondary | ICD-10-CM | POA: Diagnosis not present

## 2016-06-15 DIAGNOSIS — Z0000 Encounter for general adult medical examination without abnormal findings: Secondary | ICD-10-CM

## 2016-06-15 DIAGNOSIS — M542 Cervicalgia: Secondary | ICD-10-CM

## 2016-06-15 LAB — COMPREHENSIVE METABOLIC PANEL
ALBUMIN: 5 g/dL (ref 3.5–5.2)
ALK PHOS: 56 U/L (ref 39–117)
ALT: 29 U/L (ref 0–35)
AST: 25 U/L (ref 0–37)
BILIRUBIN TOTAL: 0.5 mg/dL (ref 0.2–1.2)
BUN: 41 mg/dL — ABNORMAL HIGH (ref 6–23)
CHLORIDE: 101 meq/L (ref 96–112)
CO2: 29 mEq/L (ref 19–32)
CREATININE: 1.04 mg/dL (ref 0.40–1.20)
Calcium: 10.2 mg/dL (ref 8.4–10.5)
GFR: 56.96 mL/min — ABNORMAL LOW (ref >60.00)
Glucose, Bld: 102 mg/dL — ABNORMAL HIGH (ref 70–99)
Potassium: 4 mEq/L (ref 3.5–5.1)
SODIUM: 140 meq/L (ref 135–145)
Total Protein: 7.9 g/dL (ref 6.0–8.3)

## 2016-06-15 LAB — CBC WITH DIFFERENTIAL/PLATELET
BASOS PCT: 0.5 % (ref 0.0–3.0)
Basophils Absolute: 0 10*3/uL (ref 0.0–0.1)
EOS ABS: 0.3 10*3/uL (ref 0.0–0.7)
Eosinophils Relative: 4 % (ref 0.0–5.0)
HEMATOCRIT: 42 % (ref 36.0–46.0)
Hemoglobin: 13.9 g/dL (ref 12.0–15.0)
LYMPHS ABS: 1.7 10*3/uL (ref 0.7–4.0)
Lymphocytes Relative: 26.2 % (ref 12.0–46.0)
MCHC: 33.1 g/dL (ref 30.0–36.0)
MCV: 92.9 fl (ref 78.0–100.0)
MONOS PCT: 8.5 % (ref 3.0–12.0)
Monocytes Absolute: 0.6 10*3/uL (ref 0.1–1.0)
NEUTROS ABS: 4.1 10*3/uL (ref 1.4–7.7)
NEUTROS PCT: 60.8 % (ref 43.0–77.0)
PLATELETS: 228 10*3/uL (ref 150.0–400.0)
RBC: 4.52 Mil/uL (ref 3.87–5.11)
RDW: 12.6 % (ref 11.5–15.5)
WBC: 6.7 10*3/uL (ref 4.0–10.5)

## 2016-06-15 LAB — LIPID PANEL
CHOLESTEROL: 171 mg/dL (ref 0–200)
HDL: 42.8 mg/dL (ref >39.00)
LDL CALC: 103 mg/dL — AB (ref 0–99)
NonHDL: 127.89
TRIGLYCERIDES: 122 mg/dL (ref 0.0–149.0)
Total CHOL/HDL Ratio: 4
VLDL: 24.4 mg/dL (ref 0.0–40.0)

## 2016-06-15 LAB — HEMOGLOBIN A1C: Hgb A1c MFr Bld: 5.8 % (ref 4.6–6.5)

## 2016-06-15 NOTE — Assessment & Plan Note (Addendum)
--  Td 2014; zostavax --2014; shingrex discussed, aware has a higher rate of s/e, elected to proceed. We are out, will call when we have it --Saw Gyn ~ 2015, declined referral to Dr Wonda Olds, encouraged to call them --MMG 06-2014 (-): declined referral, states she will call for an appointment  --Cscope 2006: tubular adenomas, Cscope ahgain 06-2011, 1 polyp, not recovered, next 5 years per report (Dr Loreta Ave); states she will wait for GI recall-letter  -- diet-exercise: Reports she is doing better, has cut down on carbohydrates and takes walks once or twice a week at least. --labs : CMP, FLP, CBC, A1c.

## 2016-06-15 NOTE — Patient Instructions (Signed)
GO TO THE LAB : Get the blood work     GO TO THE FRONT DESK Schedule your next appointment for a  checkup in 6 months  STOP BY THE FIRST FLOOR:  get the XR   Is very important to set up an appointment to see your gynecologist and to get a mammogram  You are almost due for a colonoscopy, if you don't get a letter from GI, please call Dr. Loreta Ave office. Also, you could call our office and request a referral.

## 2016-06-15 NOTE — Progress Notes (Signed)
Subjective:    Patient ID: Amy Owens, female    DOB: May 10, 1955, 61 y.o.   MRN: [id number]  DOS:  06/15/2016 Type of visit - description : cpx Interval history: No major concerns, diet has improved, much healthier, decrease CHO intake. She is taking walks regularly.   Review of Systems Continue with neck pain, for more than a year, saw a chiropractor, no help. Pain is located at the upper portion of the neck, worse on the right. Only noticeable when she flexes or extends the neck. No actual radiation, no upper or lower extremity paresthesias. No difficulty with her gait or coordinating her legs.   Other than above, a 14 point review of systems is negative    Past Medical History:  Diagnosis Date  . Anxiety   . Chest pain    atypical, neg stress test 2009  . DM (diabetes mellitus) (HCC)    borderline   . Hyperlipidemia   . Hypertension   . Insomnia    sleep disorder unspec  . Migraine headache   . Perimenopausal     Past Surgical History:  Procedure Laterality Date  . CESAREAN SECTION    . COLONOSCOPY W/ POLYPECTOMY  2006  . KNEE ARTHROSCOPY  2007  . TUBAL LIGATION      Social History   Social History  . Marital status: Married    Spouse name: N/A  . Number of children: 3  . Years of education: N/A   Occupational History  . stay home     Social History Main Topics  . Smoking status: Never Smoker  . Smokeless tobacco: Never Used  . Alcohol use No  . Drug use: No  . Sexual activity: Not on file   Other Topics Concern  . Not on file   Social History Narrative   [place]    3 children, finished college, all working       Family History  Problem Relation Age of Onset  . Coronary artery disease Mother     51s  . Diabetes Mother   . Coronary artery disease Father     36s  . Diabetes Sister   . Diabetes Brother   . Diabetes Brother   . Colon cancer Neg Hx   . Breast cancer Neg Hx      Allergies as of 06/15/2016    No Known Allergies     Medication List       Accurate as of 06/15/16 12:41 PM. Always use your most recent med list.          aspirin 81 MG chewable tablet Commonly known as:  BABY ASPIRIN Chew 1 tablet (81 mg total) by mouth daily.   cloNIDine 0.2 MG tablet Commonly known as:  CATAPRES Take 1 tablet (0.2 mg total) by mouth daily.   Eszopiclone 3 MG Tabs Commonly known as:  eszopiclone Take 1 tablet (3 mg total) by mouth at bedtime. Take immediately before bedtime   FLUoxetine 20 MG tablet Commonly known as:  PROZAC Take 1.5 tablets (30 mg total) by mouth daily.   metoprolol 100 MG tablet Commonly known as:  LOPRESSOR Take 1 tablet by mouth every morning and 1/2 tablet by mouth every evening.   olmesartan 20 MG tablet Commonly known as:  BENICAR Take 1 tablet (20 mg total) by mouth daily.   omeprazole 40 MG capsule Commonly known as:  PRILOSEC Take 1 capsule (40 mg total) by mouth daily.   rosuvastatin 10 MG  tablet Commonly known as:  CRESTOR Take 1 tablet (10 mg total) by mouth daily.   triamterene-hydrochlorothiazide 37.5-25 MG capsule Commonly known as:  DYAZIDE Take 1 each (1 capsule total) by mouth every morning.          Objective:   Physical Exam  Neck:     BP 116/74 (BP Location: Left Arm, Patient Position: Sitting, Cuff Size: Small)   Pulse (!) 53   Temp 98.3 F (36.8 C) (Oral)   Resp 14   Ht 5' (1.524 m)   Wt 148 lb (67.1 kg)   SpO2 91%   BMI 28.90 kg/m   General:   Well developed, well nourished . NAD.  Neck: No  thyromegaly . No TTP of the cervical spine. See graphic. HEENT:  Normocephalic . Face symmetric, atraumatic Lungs:  CTA B Normal respiratory effort, no intercostal retractions, no accessory muscle use. Heart: RRR,  no murmur.  No pretibial edema bilaterally  Abdomen:  Not distended, soft, non-tender. No rebound or rigidity.   Skin: Exposed areas without rash. Not pale. Not jaundice Neurologic:  alert & oriented X3.   Speech normal, gait appropriate for age and unassisted Strength symmetric and appropriate for age.  DTRs symmetric. Psych: Cognition and judgment appear intact.  Cooperative with normal attention span and concentration.  Behavior appropriate. No anxious or depressed appearing.    Assessment & Plan:   Assessment prediabetes  HTN Hyperlipidemia anxiety, insomnia: --Intolerant to [place] 2014 (HAs), lunesta ok  --rx prozac 01-2015, good results H/o  migraines H/o CP (-) stress test 2009 +FH CAD, DM   PLAN  Prediabetes, HTN, hyperlipidemia, anxiety: All well-controlled, we are checking labs, continue Benicar, clonidine, lunesta, fluoxetine, metoprolol, Benicar, Prilosec, Crestor, diuretics and a baby aspirin. Neck pain: Going on for at least a year, no radiculopathy, rx a x-ray, previously the chiropractor manipulation did not help, she plans to call her orthopedic doctor, will let me know if she needs a formal referral. Denies need for pain meds. RTC 6 months

## 2016-06-15 NOTE — Progress Notes (Signed)
Pre visit review using our clinic review tool, if applicable. No additional management support is needed unless otherwise documented below in the visit note. 

## 2016-06-15 NOTE — Assessment & Plan Note (Signed)
Prediabetes, HTN, hyperlipidemia, anxiety: All well-controlled, we are checking labs, continue Benicar, clonidine, lunesta, fluoxetine, metoprolol, Benicar, Prilosec, Crestor, diuretics and a baby aspirin. Neck pain: Going on for at least a year, no radiculopathy, rx a x-ray, previously the chiropractor manipulation did not help, she plans to call her orthopedic doctor, will let me know if she needs a formal referral. Denies need for pain meds. RTC 6 months

## 2016-07-15 ENCOUNTER — Other Ambulatory Visit: Payer: Self-pay | Admitting: Internal Medicine

## 2016-07-15 DIAGNOSIS — E785 Hyperlipidemia, unspecified: Secondary | ICD-10-CM

## 2016-07-16 ENCOUNTER — Other Ambulatory Visit: Payer: Self-pay | Admitting: Family Medicine

## 2016-07-31 ENCOUNTER — Telehealth: Payer: Self-pay

## 2016-07-31 NOTE — Telephone Encounter (Signed)
-----   Message from Wanda PlumpJose E Paz, MD sent at 07/30/2016 12:18 PM EDT ----- Regarding: send her a my chart message Marissa NestleMercedes, SHINGREX  the new vaccination against shingles is in back order, we won't have that in the next several months. If your pharmacist offers you one that is okay, simply call for a prescription.

## 2016-07-31 NOTE — Telephone Encounter (Signed)
My Chart message sent

## 2016-08-02 ENCOUNTER — Other Ambulatory Visit: Payer: Self-pay | Admitting: Internal Medicine

## 2016-08-07 ENCOUNTER — Other Ambulatory Visit: Payer: Self-pay

## 2016-08-07 MED ORDER — FLUOXETINE HCL 20 MG PO TABS: 30.0000 mg | ORAL_TABLET | Freq: Every day | ORAL | 1 refills | Status: DC

## 2016-09-02 ENCOUNTER — Other Ambulatory Visit: Payer: Self-pay | Admitting: Internal Medicine

## 2016-09-04 ENCOUNTER — Other Ambulatory Visit: Payer: Self-pay

## 2016-09-04 MED ORDER — OMEPRAZOLE 40 MG PO CPDR: 40.0000 mg | DELAYED_RELEASE_CAPSULE | Freq: Every day | ORAL | 3 refills | Status: DC

## 2016-09-04 NOTE — Telephone Encounter (Signed)
Rx faxed to [facility] pharmacy.  

## 2016-09-04 NOTE — Telephone Encounter (Signed)
Pt is requesting refill on Lunesta 3mg  tabs.  Last OV: 06/15/2016 Last Fill: 02/14/2016 #90 and 1RF  [place] Controlled Substance database printed; no issues noted.  Please advise.

## 2016-09-04 NOTE — Telephone Encounter (Signed)
Rx printed, awaiting MD signature.  

## 2016-09-04 NOTE — Telephone Encounter (Signed)
Ok 90 and 1 

## 2016-11-11 ENCOUNTER — Other Ambulatory Visit: Payer: Self-pay | Admitting: Internal Medicine

## 2016-11-11 DIAGNOSIS — E785 Hyperlipidemia, unspecified: Secondary | ICD-10-CM

## 2016-11-21 ENCOUNTER — Other Ambulatory Visit: Payer: Self-pay | Admitting: Internal Medicine

## 2016-11-21 DIAGNOSIS — R921 Mammographic calcification found on diagnostic imaging of breast: Secondary | ICD-10-CM

## 2016-11-28 LAB — BASIC METABOLIC PANEL
BUN: 29 — AB (ref 4–21)
Creatinine: 1 (ref 0.5–1.1)
GLUCOSE: 95
Potassium: 4.6 (ref 3.4–5.3)
SODIUM: 143 (ref 137–147)

## 2016-11-29 ENCOUNTER — Ambulatory Visit
Admission: RE | Admit: 2016-11-29 | Discharge: 2016-11-29 | Disposition: A | Discharge status: Home / Self Care | Payer: BLUE CROSS/BLUE SHIELD | Source: Ambulatory Visit | Attending: Internal Medicine | Admitting: Internal Medicine

## 2016-11-29 DIAGNOSIS — R921 Mammographic calcification found on diagnostic imaging of breast: Secondary | ICD-10-CM

## 2016-12-01 ENCOUNTER — Telehealth: Payer: Self-pay | Admitting: *Deleted

## 2016-12-01 NOTE — Telephone Encounter (Signed)
Received lab results from [facility]; forwarded to provider/SLS 10/05

## 2016-12-04 NOTE — Telephone Encounter (Signed)
Results noted, will extract

## 2016-12-06 ENCOUNTER — Encounter: Payer: Self-pay | Admitting: Internal Medicine

## 2016-12-11 LAB — HM COLONOSCOPY

## 2016-12-15 ENCOUNTER — Ambulatory Visit (INDEPENDENT_AMBULATORY_CARE_PROVIDER_SITE_OTHER): Payer: BLUE CROSS/BLUE SHIELD | Admitting: Internal Medicine

## 2016-12-15 ENCOUNTER — Encounter: Payer: Self-pay | Admitting: Internal Medicine

## 2016-12-15 VITALS — BP 124/68 | HR 48 | Temp 98.2°F | Resp 14 | Ht 60.0 in | Wt 149.2 lb

## 2016-12-15 DIAGNOSIS — M542 Cervicalgia: Secondary | ICD-10-CM | POA: Diagnosis not present

## 2016-12-15 DIAGNOSIS — E785 Hyperlipidemia, unspecified: Secondary | ICD-10-CM

## 2016-12-15 DIAGNOSIS — I10 Essential (primary) hypertension: Secondary | ICD-10-CM | POA: Diagnosis not present

## 2016-12-15 DIAGNOSIS — Z23 Encounter for immunization: Secondary | ICD-10-CM | POA: Diagnosis not present

## 2016-12-15 LAB — BASIC METABOLIC PANEL
BUN: 26 mg/dL — ABNORMAL HIGH (ref 6–23)
CALCIUM: 10.2 mg/dL (ref 8.4–10.5)
CO2: 31 meq/L (ref 19–32)
CREATININE: 0.93 mg/dL (ref 0.40–1.20)
Chloride: 98 mEq/L (ref 96–112)
GFR: 64.7 mL/min (ref >60.00)
Glucose, Bld: 101 mg/dL — ABNORMAL HIGH (ref 70–99)
POTASSIUM: 4.3 meq/L (ref 3.5–5.1)
Sodium: 138 mEq/L (ref 135–145)

## 2016-12-15 MED ORDER — ESZOPICLONE 3 MG PO TABS: 3.0000 mg | ORAL_TABLET | Freq: Every day | ORAL | 0 refills | Status: DC

## 2016-12-15 MED ORDER — METOPROLOL TARTRATE 100 MG PO TABS: ORAL_TABLET | ORAL | 1 refills | Status: DC

## 2016-12-15 MED ORDER — CLONIDINE HCL 0.2 MG PO TABS: 0.2000 mg | ORAL_TABLET | Freq: Every day | ORAL | 1 refills | Status: DC

## 2016-12-15 MED ORDER — TRIAMTERENE-HCTZ 37.5-25 MG PO CAPS: 1.0000 | ORAL_CAPSULE | Freq: Every morning | ORAL | 1 refills | Status: DC

## 2016-12-15 MED ORDER — OLMESARTAN MEDOXOMIL 20 MG PO TABS: 20.0000 mg | ORAL_TABLET | Freq: Every day | ORAL | 1 refills | Status: DC

## 2016-12-15 MED ORDER — ROSUVASTATIN CALCIUM 10 MG PO TABS: 10.0000 mg | ORAL_TABLET | Freq: Every day | ORAL | 1 refills | Status: DC

## 2016-12-15 MED ORDER — FLUOXETINE HCL 20 MG PO TABS: 30.0000 mg | ORAL_TABLET | Freq: Every day | ORAL | 1 refills | Status: DC

## 2016-12-15 NOTE — Progress Notes (Signed)
Subjective:    Patient ID: Amy Owens, female    DOB: 1956/01/18, 61 y.o.   MRN: [id number]  DOS:  12/15/2016 Type of visit - description : rov Interval history: In general feeling well. Had a colonoscopy a few days ago, results have not been sent to [place] yet. Prior to the colonoscopy, GI check a BMP, BUN was a slightly elevated.   Review of Systems Denies nausea, vomiting, diarrhea. Ambulatory BPs within normal Neck, see last OV: Still hurting but not as severely.   Past Medical History:  Diagnosis Date  . Anxiety   . Chest pain    atypical, neg stress test 2009  . DM (diabetes mellitus) (HCC)    borderline   . Hyperlipidemia   . Hypertension   . Insomnia    sleep disorder unspec  . Migraine headache   . Perimenopausal     Past Surgical History:  Procedure Laterality Date  . CESAREAN SECTION    . COLONOSCOPY W/ POLYPECTOMY  2006  . KNEE ARTHROSCOPY  2007  . TUBAL LIGATION      Social History   Social History  . Marital status: Married    Spouse name: N/A  . Number of children: 3  . Years of education: N/A   Occupational History  . stay home     Social History Main Topics  . Smoking status: Never Smoker  . Smokeless tobacco: Never Used  . Alcohol use No  . Drug use: No  . Sexual activity: Not on file   Other Topics Concern  . Not on file   Social History Narrative   Original from [place]    3 children, finished college, all working        Allergies as of 12/15/2016   No Known Allergies     Medication List       Accurate as of 12/15/16 11:59 PM. Always use your most recent med list.          aspirin 81 MG chewable tablet Commonly known as:  BABY ASPIRIN Chew 1 tablet (81 mg total) by mouth daily.   cloNIDine 0.2 MG tablet Commonly known as:  CATAPRES Take 1 tablet (0.2 mg total) by mouth daily.   Eszopiclone 3 MG Tabs Take 1 tablet (3 mg total) by mouth at bedtime.   FLUoxetine 20 MG tablet Commonly known  as:  PROZAC Take 1.5 tablets (30 mg total) by mouth daily.   metoprolol tartrate 100 MG tablet Commonly known as:  LOPRESSOR Take 1 tablet by mouth every morning and 1/2 tablet by mouth every evening.   olmesartan 20 MG tablet Commonly known as:  BENICAR Take 1 tablet (20 mg total) by mouth daily.   omeprazole 40 MG capsule Commonly known as:  PRILOSEC Take 1 capsule (40 mg total) by mouth daily.   rosuvastatin 10 MG tablet Commonly known as:  CRESTOR Take 1 tablet (10 mg total) by mouth daily.   triamterene-hydrochlorothiazide 37.5-25 MG capsule Commonly known as:  DYAZIDE Take 1 each (1 capsule total) by mouth every morning.          Objective:   Physical Exam BP 124/68 (BP Location: Left Arm, Patient Position: Sitting, Cuff Size: Small)   Pulse (!) 48   Temp 98.2 F (36.8 C) (Oral)   Resp 14   Ht 5' (1.524 m)   Wt 149 lb 4 oz (67.7 kg)   SpO2 98%   BMI 29.15 kg/m  General:   Well developed, well  nourished . NAD.  HEENT:  Normocephalic . Face symmetric, atraumatic Lungs:  CTA B Normal respiratory effort, no intercostal retractions, no accessory muscle use. Heart: RRR,  no murmur.  no pretibial edema bilaterally  Abdomen:  Not distended, soft, non-tender. No rebound or rigidity.   Skin: Not pale. Not jaundice Neurologic:  alert & oriented X3.  Speech normal, gait appropriate for age and unassisted Psych--  Cognition and judgment appear intact.  Cooperative with normal attention span and concentration.  Behavior appropriate. No anxious or depressed appearing.     Assessment & Plan:   Assessment prediabetes  HTN Hyperlipidemia anxiety, insomnia: --[place]olerant to [place] 2014 ([place]), lunesta ok  --rx prozac 01-2015, good results H/o  migraines H/o CP (-) stress test 2009 +FH CAD, DM   PLAN  HTN: Seems well-controlled with clonidine, Lopressor, Benicar, Dyazide. BUN when checked by GI, was a slightly elevated. We'll recheck it today taking in  consideration that he had a prep for colonoscopy 4 days ago. Strongly recommend to keep herself very well hydrated with plenty of water. Neck pain: did not see orthopedic surgery, sx have decreased. Flu shot today RTC 6 months, CPX

## 2016-12-15 NOTE — Progress Notes (Signed)
Pre visit review using our clinic review tool, if applicable. No additional management support is needed unless otherwise documented below in the visit note. 

## 2016-12-15 NOTE — Patient Instructions (Addendum)
GO TO THE LAB : Get the blood work     GO TO THE FRONT DESK Schedule your next appointment for a physical exam, fasting in 6 months   

## 2016-12-17 NOTE — Assessment & Plan Note (Signed)
HTN: Seems well-controlled with clonidine, Lopressor, Benicar, Dyazide. BUN when checked by GI, was a slightly elevated. We'll recheck it today taking in consideration that he had a prep for colonoscopy 4 days ago. Strongly recommend to keep herself very well hydrated with plenty of water. Neck pain: did not see orthopedic surgery, sx have decreased. Flu shot today RTC 6 months, CPX

## 2016-12-27 ENCOUNTER — Telehealth: Payer: Self-pay

## 2016-12-27 NOTE — Telephone Encounter (Signed)
Your PA request has been approved. Additional information will be provided in the approval communication. (Message 1145). Effective 12/27/2016 through 12/28/2019.

## 2016-12-27 NOTE — Telephone Encounter (Signed)
PA initiated via Covermymeds; KEY: [id number]. Awaiting determination.

## 2017-05-17 ENCOUNTER — Other Ambulatory Visit: Payer: Self-pay | Admitting: Internal Medicine

## 2017-05-17 DIAGNOSIS — E785 Hyperlipidemia, unspecified: Secondary | ICD-10-CM

## 2017-06-18 ENCOUNTER — Other Ambulatory Visit: Payer: Self-pay

## 2017-06-19 ENCOUNTER — Telehealth: Payer: Self-pay | Admitting: Internal Medicine

## 2017-06-19 ENCOUNTER — Ambulatory Visit (INDEPENDENT_AMBULATORY_CARE_PROVIDER_SITE_OTHER): Payer: BLUE CROSS/BLUE SHIELD | Admitting: Internal Medicine

## 2017-06-19 ENCOUNTER — Other Ambulatory Visit: Payer: Self-pay | Admitting: Internal Medicine

## 2017-06-19 ENCOUNTER — Encounter: Payer: Self-pay | Admitting: Internal Medicine

## 2017-06-19 VITALS — BP 116/78 | HR 58 | Temp 98.3°F | Resp 14 | Ht 60.0 in | Wt 152.1 lb

## 2017-06-19 DIAGNOSIS — Z780 Asymptomatic menopausal state: Secondary | ICD-10-CM

## 2017-06-19 DIAGNOSIS — R739 Hyperglycemia, unspecified: Secondary | ICD-10-CM | POA: Diagnosis not present

## 2017-06-19 DIAGNOSIS — Z0000 Encounter for general adult medical examination without abnormal findings: Secondary | ICD-10-CM | POA: Diagnosis not present

## 2017-06-19 DIAGNOSIS — E785 Hyperlipidemia, unspecified: Secondary | ICD-10-CM

## 2017-06-19 DIAGNOSIS — Z1211 Encounter for screening for malignant neoplasm of colon: Secondary | ICD-10-CM

## 2017-06-19 LAB — HEMOGLOBIN A1C: Hgb A1c MFr Bld: 5.6 % (ref 4.6–6.5)

## 2017-06-19 LAB — LIPID PANEL
CHOL/HDL RATIO: 4
Cholesterol: 175 mg/dL (ref 0–200)
HDL: 47.3 mg/dL (ref >39.00)
LDL Cholesterol: 99 mg/dL (ref 0–99)
NONHDL: 127.25
TRIGLYCERIDES: 140 mg/dL (ref 0.0–149.0)
VLDL: 28 mg/dL (ref 0.0–40.0)

## 2017-06-19 LAB — COMPREHENSIVE METABOLIC PANEL
ALT: 20 U/L (ref 0–35)
AST: 21 U/L (ref 0–37)
Albumin: 4.6 g/dL (ref 3.5–5.2)
Alkaline Phosphatase: 69 U/L (ref 39–117)
BUN: 35 mg/dL — ABNORMAL HIGH (ref 6–23)
CALCIUM: 10 mg/dL (ref 8.4–10.5)
CO2: 32 meq/L (ref 19–32)
CREATININE: 1 mg/dL (ref 0.40–1.20)
Chloride: 102 mEq/L (ref 96–112)
GFR: 59.41 mL/min — ABNORMAL LOW (ref >60.00)
GLUCOSE: 110 mg/dL — AB (ref 70–99)
Potassium: 4.9 mEq/L (ref 3.5–5.1)
Sodium: 141 mEq/L (ref 135–145)
Total Bilirubin: 0.4 mg/dL (ref 0.2–1.2)
Total Protein: 7.4 g/dL (ref 6.0–8.3)

## 2017-06-19 MED ORDER — ROSUVASTATIN CALCIUM 10 MG PO TABS: 10.0000 mg | ORAL_TABLET | Freq: Every day | ORAL | 1 refills | Status: DC

## 2017-06-19 NOTE — Telephone Encounter (Signed)
sent 

## 2017-06-19 NOTE — Telephone Encounter (Signed)
Pt is requesting refill on Lunesta 3mg .   Last OV: 06/19/2017 Last Fill: 12/15/2016 #90 and 0RF UDS: None  [place] printed- no discrepancies noted- sent for scanning  Please advise.

## 2017-06-19 NOTE — Progress Notes (Signed)
Subjective:    Patient ID: Amy Owens, female    DOB: 1955-03-27, 62 y.o.   MRN: [id number]  DOS:  06/19/2017 Type of visit - description : CPX Interval history: No concerns Wt Readings from Last 3 Encounters:  06/19/17 152 lb 2 oz (69 kg)  12/15/16 149 lb 4 oz (67.7 kg)  06/15/16 148 lb (67.1 kg)    Review of Systems  A 14 point review of systems is negative    Past Medical History:  Diagnosis Date  . Anxiety   . Chest pain    atypical, neg stress test 2009  . DM (diabetes mellitus) (HCC)    borderline   . Hyperlipidemia   . Hypertension   . Insomnia    sleep disorder unspec  . Migraine headache   . Perimenopausal     Past Surgical History:  Procedure Laterality Date  . CESAREAN SECTION    . COLONOSCOPY W/ POLYPECTOMY  2006  . KNEE ARTHROSCOPY  2007  . TUBAL LIGATION      Social History   Socioeconomic History  . Marital status: Married    Spouse name: Not on file  . Number of children: 3  . Years of education: Not on file  . Highest education level: Not on file  Occupational History  . Occupation: stay home   Social Needs  . Financial resource strain: Not on file  . Food insecurity:    Worry: Not on file    Inability: Not on file  . Transportation needs:    Medical: Not on file    Non-medical: Not on file  Tobacco Use  . Smoking status: Never Smoker  . Smokeless tobacco: Never Used  Substance and Sexual Activity  . Alcohol use: No  . Drug use: No  . Sexual activity: Not on file  Lifestyle  . Physical activity:    Days per week: Not on file    Minutes per session: Not on file  . Stress: Not on file  Relationships  . Social connections:    Talks on phone: Not on file    Gets together: Not on file    Attends religious service: Not on file    Active member of club or organization: Not on file    Attends meetings of clubs or organizations: Not on file    Relationship status: Not on file  . Intimate partner violence:    Fear  of current or ex partner: Not on file    Emotionally abused: Not on file    Physically abused: Not on file    Forced sexual activity: Not on file  Other Topics Concern  . Not on file  Social History Narrative   Original from [place].    3 children, finished college, all working       Family History  Problem Relation Age of Onset  . Coronary artery disease Mother        6150s  . Diabetes Mother   . Coronary artery disease Father        140s  . Diabetes Sister   . Diabetes Brother   . Diabetes Brother   . Colon cancer Neg Hx   . Breast cancer Neg Hx     Allergies as of 06/19/2017   No Known Allergies     Medication List        Accurate as of 06/19/17  3:35 PM. Always use your most recent med list.  aspirin 81 MG chewable tablet Commonly known as:  BABY ASPIRIN Chew 1 tablet (81 mg total) by mouth daily.   cloNIDine 0.2 MG tablet Commonly known as:  CATAPRES Take 1 tablet (0.2 mg total) by mouth daily.   Eszopiclone 3 MG Tabs Take 1 tablet (3 mg total) by mouth at bedtime.   FLUoxetine 20 MG tablet Commonly known as:  PROZAC Take 1.5 tablets (30 mg total) by mouth daily.   metoprolol tartrate 100 MG tablet Commonly known as:  LOPRESSOR TAKE 1 TABLET BY MOUTH EVERY MORNING AND 1/2 EVERY EVENING   olmesartan 20 MG tablet Commonly known as:  BENICAR Take 1 tablet (20 mg total) by mouth daily.   omeprazole 40 MG capsule Commonly known as:  PRILOSEC Take 1 capsule (40 mg total) by mouth daily.   rosuvastatin 10 MG tablet Commonly known as:  CRESTOR Take 1 tablet (10 mg total) by mouth daily.   triamterene-hydrochlorothiazide 37.5-25 MG capsule Commonly known as:  DYAZIDE Take 1 each (1 capsule total) by mouth every morning.          Objective:   Physical Exam BP 116/78 (BP Location: Left Arm, Patient Position: Sitting, Cuff Size: Small)   Pulse (!) 58   Temp 98.3 F (36.8 C) (Oral)   Resp 14   Ht 5' (1.524 m)   Wt 152 lb 2 oz (69 kg)    SpO2 97%   BMI 29.71 kg/m  General:   Well developed, well nourished . NAD.  Neck: No  thyromegaly  HEENT:  Normocephalic . Face symmetric, atraumatic Lungs:  CTA B Normal respiratory effort, no intercostal retractions, no accessory muscle use. Heart: RRR,  no murmur.  No pretibial edema bilaterally  Abdomen:  Not distended, soft, non-tender. No rebound or rigidity.   Skin: Exposed areas without rash. Not pale. Not jaundice Neurologic:  alert & oriented X3.  Speech normal, gait appropriate for age and unassisted Strength symmetric and appropriate for age.  Psych: Cognition and judgment appear intact.  Cooperative with normal attention span and concentration.  Behavior appropriate. No anxious or depressed appearing.     Assessment & Plan:    Assessment prediabetes  HTN Hyperlipidemia anxiety, insomnia: --Intolerant to [place] 2014 (HAs), lunesta ok  --rx prozac 01-2015, good results H/o  migraines H/o CP (-) stress test 2009 +FH CAD, DM   PLAN Prediabetes: Diet controlled, check A1c HTN: Well-controlled on clonidine, metoprolol, Benicar, Dyazide.  Checking labs Hyperlipidemia: On Crestor, checking labs Anxiety insomnia: Controlled on Prozac and Lunesta. RTC 6 to 7 months

## 2017-06-19 NOTE — Assessment & Plan Note (Signed)
Prediabetes: Diet controlled, check A1c HTN: Well-controlled on clonidine, metoprolol, Benicar, Dyazide.  Checking labs Hyperlipidemia: On Crestor, checking labs Anxiety insomnia: Controlled on Prozac and Lunesta. RTC 6 to 7 months

## 2017-06-19 NOTE — Assessment & Plan Note (Addendum)
--  Td 2014; zostavax : 2014; shingrex not available  -- female care: PAP 12/2016; MMG 11/2016  --Cscope 2006: tubular adenomas, Cscope ahgain 06-2011, 1 polyp, not recovered, saw Dr Loreta AveMann recently reports a Cscope, will get records  -- diet-exercise: She is actually doing well, trying to eat healthier --Check a bone density test --labs: CMP, FLP, A1c

## 2017-06-19 NOTE — Patient Instructions (Signed)
GO TO THE LAB : Get the blood work     GO TO THE FRONT DESK Schedule your next appointment for a  routine checkup in 6 months  

## 2017-06-19 NOTE — Progress Notes (Signed)
Pre visit review using our clinic review tool, if applicable. No additional management support is needed unless otherwise documented below in the visit note. 

## 2017-06-26 ENCOUNTER — Encounter: Payer: Self-pay | Admitting: Internal Medicine

## 2017-08-06 ENCOUNTER — Encounter: Payer: Self-pay | Admitting: Internal Medicine

## 2017-08-07 ENCOUNTER — Telehealth: Payer: Self-pay

## 2017-08-07 NOTE — Telephone Encounter (Signed)
PA initiated via Covermymeds; KEY: [id number]. Received real-time PA approval.   Effective 07/08/2017- 08/07/2018.

## 2017-08-17 ENCOUNTER — Other Ambulatory Visit: Payer: Self-pay | Admitting: Internal Medicine

## 2017-11-19 ENCOUNTER — Other Ambulatory Visit: Payer: Self-pay | Admitting: Internal Medicine

## 2017-11-20 ENCOUNTER — Other Ambulatory Visit: Payer: Self-pay

## 2017-11-20 MED ORDER — FLUOXETINE HCL 20 MG PO TABS: 30.0000 mg | ORAL_TABLET | Freq: Every day | ORAL | 1 refills | Status: DC

## 2017-12-20 ENCOUNTER — Ambulatory Visit (INDEPENDENT_AMBULATORY_CARE_PROVIDER_SITE_OTHER): Payer: BLUE CROSS/BLUE SHIELD | Admitting: Internal Medicine

## 2017-12-20 ENCOUNTER — Encounter: Payer: Self-pay | Admitting: Internal Medicine

## 2017-12-20 VITALS — BP 126/62 | HR 51 | Temp 97.9°F | Resp 16 | Ht 60.0 in | Wt 150.2 lb

## 2017-12-20 DIAGNOSIS — I10 Essential (primary) hypertension: Secondary | ICD-10-CM

## 2017-12-20 DIAGNOSIS — Z23 Encounter for immunization: Secondary | ICD-10-CM

## 2017-12-20 DIAGNOSIS — F411 Generalized anxiety disorder: Secondary | ICD-10-CM | POA: Diagnosis not present

## 2017-12-20 DIAGNOSIS — E785 Hyperlipidemia, unspecified: Secondary | ICD-10-CM

## 2017-12-20 DIAGNOSIS — G4700 Insomnia, unspecified: Secondary | ICD-10-CM | POA: Diagnosis not present

## 2017-12-20 DIAGNOSIS — Z780 Asymptomatic menopausal state: Secondary | ICD-10-CM

## 2017-12-20 LAB — BASIC METABOLIC PANEL
BUN: 45 mg/dL — ABNORMAL HIGH (ref 6–23)
CO2: 29 meq/L (ref 19–32)
Calcium: 10.1 mg/dL (ref 8.4–10.5)
Chloride: 100 mEq/L (ref 96–112)
Creatinine, Ser: 1.14 mg/dL (ref 0.40–1.20)
GFR: 50.99 mL/min — ABNORMAL LOW (ref >60.00)
Glucose, Bld: 108 mg/dL — ABNORMAL HIGH (ref 70–99)
Potassium: 4 mEq/L (ref 3.5–5.1)
SODIUM: 139 meq/L (ref 135–145)

## 2017-12-20 MED ORDER — OLMESARTAN MEDOXOMIL 20 MG PO TABS: 20.0000 mg | ORAL_TABLET | Freq: Every day | ORAL | 2 refills | Status: DC

## 2017-12-20 MED ORDER — CLONIDINE HCL 0.2 MG PO TABS: 0.2000 mg | ORAL_TABLET | Freq: Every day | ORAL | 2 refills | Status: DC

## 2017-12-20 MED ORDER — FLUOXETINE HCL 20 MG PO TABS: 30.0000 mg | ORAL_TABLET | Freq: Every day | ORAL | 2 refills | Status: DC

## 2017-12-20 MED ORDER — TRIAMTERENE-HCTZ 37.5-25 MG PO CAPS: 1.0000 | ORAL_CAPSULE | Freq: Every morning | ORAL | 2 refills | Status: DC

## 2017-12-20 MED ORDER — ROSUVASTATIN CALCIUM 10 MG PO TABS: 10.0000 mg | ORAL_TABLET | Freq: Every day | ORAL | 2 refills | Status: DC

## 2017-12-20 MED ORDER — METOPROLOL TARTRATE 100 MG PO TABS: ORAL_TABLET | ORAL | 2 refills | Status: DC

## 2017-12-20 MED ORDER — ESZOPICLONE 3 MG PO TABS: 3.0000 mg | ORAL_TABLET | Freq: Every day | ORAL | 2 refills | Status: DC

## 2017-12-20 NOTE — Progress Notes (Signed)
Subjective:    Patient ID: Amy Owens, female    DOB: [birth year], 62 y.o.   MRN: [id number]  DOS:  12/20/2017 Type of visit - description : f/u Interval history: No major concerns. HTN: Good med compliance, no recent ambulatory BPs Insomnia: Well-controlled.  Review of Systems Denies chest pain no difficulty breathing.  No anxiety or depression with current meds  Past Medical History:  Diagnosis Date  . Anxiety   . Chest pain    atypical, neg stress test 2009  . DM (diabetes mellitus) (HCC)    borderline   . Hyperlipidemia   . Hypertension   . Insomnia    sleep disorder unspec  . Migraine headache   . Perimenopausal     Past Surgical History:  Procedure Laterality Date  . CESAREAN SECTION    . COLONOSCOPY W/ POLYPECTOMY  2006  . KNEE ARTHROSCOPY  2007  . TUBAL LIGATION      Social History   Socioeconomic History  . Marital status: Married    Spouse name: Not on file  . Number of children: 3  . Years of education: Not on file  . Highest education level: Not on file  Occupational History  . Occupation: stay home   Social Needs  . Financial resource strain: Not on file  . Food insecurity:    Worry: Not on file    Inability: Not on file  . Transportation needs:    Medical: Not on file    Non-medical: Not on file  Tobacco Use  . Smoking status: Never Smoker  . Smokeless tobacco: Never Used  Substance and Sexual Activity  . Alcohol use: No  . Drug use: No  . Sexual activity: Not on file  Lifestyle  . Physical activity:    Days per week: Not on file    Minutes per session: Not on file  . Stress: Not on file  Relationships  . Social connections:    Talks on phone: Not on file    Gets together: Not on file    Attends religious service: Not on file    Active member of club or organization: Not on file    Attends meetings of clubs or organizations: Not on file    Relationship status: Not on file  . Intimate partner violence:    Fear of  current or ex partner: Not on file    Emotionally abused: Not on file    Physically abused: Not on file    Forced sexual activity: Not on file  Other Topics Concern  . Not on file  Social History Narrative   Original from [place]    3 children, finished college, all working        Allergies as of 12/20/2017   No Known Allergies     Medication List        Accurate as of 12/20/17 11:59 PM. Always use your most recent med list.          aspirin 81 MG chewable tablet Chew 1 tablet (81 mg total) by mouth daily.   cloNIDine 0.2 MG tablet Commonly known as:  CATAPRES Take 1 tablet (0.2 mg total) by mouth daily.   Eszopiclone 3 MG Tabs Take 1 tablet (3 mg total) by mouth at bedtime.   FLUoxetine 20 MG tablet Commonly known as:  PROZAC Take 1.5 tablets (30 mg total) by mouth daily.   metoprolol tartrate 100 MG tablet Commonly known as:  LOPRESSOR TAKE 1 TABLET BY MOUTH  EVERY MORNING AND 1/2 EVERY EVENING   olmesartan 20 MG tablet Commonly known as:  BENICAR Take 1 tablet (20 mg total) by mouth daily.   omeprazole 40 MG capsule Commonly known as:  PRILOSEC Take 1 capsule (40 mg total) by mouth daily before breakfast.   rosuvastatin 10 MG tablet Commonly known as:  CRESTOR Take 1 tablet (10 mg total) by mouth daily.   triamterene-hydrochlorothiazide 37.5-25 MG capsule Commonly known as:  DYAZIDE Take 1 each (1 capsule total) by mouth every morning.          Objective:   Physical Exam BP 126/62 (BP Location: Left Arm, Patient Position: Sitting, Cuff Size: Normal)   Pulse (!) 51   Temp 97.9 F (36.6 C) (Oral)   Resp 16   Ht 5' (1.524 m)   Wt 150 lb 4 oz (68.2 kg)   SpO2 93%   BMI 29.34 kg/m  General:   Well developed, NAD, see BMI.  HEENT:  Normocephalic . Face symmetric, atraumatic Lungs:  CTA B Normal respiratory effort, no intercostal retractions, no accessory muscle use. Heart: RRR,  no murmur.  No pretibial edema bilaterally  Skin: Not  pale. Not jaundice Neurologic:  alert & oriented X3.  Speech normal, gait appropriate for age and unassisted Psych--  Cognition and judgment appear intact.  Cooperative with normal attention span and concentration.  Behavior appropriate. No anxious or depressed appearing.     Assessment & Plan:   Assessment prediabetes  HTN Hyperlipidemia anxiety, insomnia: --Intolerant to [place] 2014 (HAs), lunesta ok  --rx prozac 01-2015, good results H/o  migraines H/o CP (-) stress test 2009 +FH CAD, DM   PLAN HTN: BP today is very good, continue clonidine, metoprolol, Benicar, Dyazide.  Check a BMP Anxiety, insomnia: Well-controlled on Prozac and Lunesta. Preventive care: Flu shot provided, DEXA ordered the last visit but not done, will try again. RTC 6 months, CPX

## 2017-12-20 NOTE — Progress Notes (Signed)
Pre visit review using our clinic review tool, if applicable. No additional management support is needed unless otherwise documented below in the visit note. 

## 2017-12-20 NOTE — Patient Instructions (Addendum)
GO TO THE LAB : Get the blood work     GO TO THE FRONT DESK Schedule your next appointment for a   a physical exam in 6 months, fasting     STOP BY THE FIRST FLOOR:    schedule a bone density test

## 2017-12-21 ENCOUNTER — Ambulatory Visit (HOSPITAL_BASED_OUTPATIENT_CLINIC_OR_DEPARTMENT_OTHER)
Admission: RE | Admit: 2017-12-21 | Discharge: 2017-12-21 | Disposition: A | Discharge status: Home / Self Care | Payer: BLUE CROSS/BLUE SHIELD | Source: Ambulatory Visit | Attending: Internal Medicine | Admitting: Internal Medicine

## 2017-12-21 DIAGNOSIS — Z13820 Encounter for screening for osteoporosis: Secondary | ICD-10-CM | POA: Insufficient documentation

## 2017-12-21 DIAGNOSIS — Z780 Asymptomatic menopausal state: Secondary | ICD-10-CM | POA: Insufficient documentation

## 2017-12-21 DIAGNOSIS — M8580 Other specified disorders of bone density and structure, unspecified site: Secondary | ICD-10-CM | POA: Diagnosis not present

## 2017-12-23 NOTE — Assessment & Plan Note (Signed)
HTN: BP today is very good, continue clonidine, metoprolol, Benicar, Dyazide.  Check a BMP Anxiety, insomnia: Well-controlled on Prozac and Lunesta. Preventive care: Flu shot provided, DEXA ordered the last visit but not done, will try again. RTC 6 months, CPX

## 2018-01-22 ENCOUNTER — Other Ambulatory Visit: Payer: Self-pay | Admitting: Internal Medicine

## 2018-01-25 ENCOUNTER — Telehealth: Payer: Self-pay | Admitting: Internal Medicine

## 2018-01-25 ENCOUNTER — Other Ambulatory Visit: Payer: Self-pay | Admitting: Internal Medicine

## 2018-01-25 NOTE — Telephone Encounter (Unsigned)
Copied from CRM [phone]. Topic: Quick Communication - Rx Refill/Question >> Jan 25, 2018  1:49 PM Percival SpanishKennedy, Cheryl W wrote: Medication  Eszopiclone 3 MG TABS    this RX was sent on 12/20/17 pharmacy said they never received the RX please resend  Has the patient contacted their pharmacy yes    [facility]   Agent: Please be advised that RX refills may take up to 3 business days. We ask that you follow-up with your pharmacy.

## 2018-01-31 ENCOUNTER — Other Ambulatory Visit: Payer: Self-pay | Admitting: Internal Medicine

## 2018-02-23 ENCOUNTER — Other Ambulatory Visit: Payer: Self-pay | Admitting: Internal Medicine

## 2018-02-23 ENCOUNTER — Encounter: Payer: Self-pay | Admitting: Internal Medicine

## 2018-02-25 ENCOUNTER — Other Ambulatory Visit: Payer: Self-pay | Admitting: Internal Medicine

## 2018-02-25 MED ORDER — ESZOPICLONE 3 MG PO TABS: 3.0000 mg | ORAL_TABLET | Freq: Every day | ORAL | 2 refills | Status: DC

## 2018-06-03 ENCOUNTER — Telehealth: Payer: Self-pay

## 2018-06-03 MED ORDER — ESOMEPRAZOLE MAGNESIUM 40 MG PO CPDR: 40.0000 mg | DELAYED_RELEASE_CAPSULE | Freq: Every day | ORAL | 1 refills | Status: DC

## 2018-06-03 NOTE — Telephone Encounter (Signed)
Omeprazole 40mg  capsules not covered. Preferred alternative is esomeprazole capsules. Please advise.

## 2018-06-03 NOTE — Telephone Encounter (Signed)
Esomeprazole 40mg  sent.

## 2018-06-03 NOTE — Telephone Encounter (Signed)
Okay esomeprazole 40 mg 1 tablet daily, 55-month supply

## 2018-06-18 ENCOUNTER — Telehealth: Payer: Self-pay

## 2018-06-18 NOTE — Telephone Encounter (Signed)
LVM in spanish for pt to call the office and explain about VOV can be done for CPE with [employer]. Offered on message VOV and explain pt to call the office and ask for Annice Pih so I can explain about the visit. (Did not cancel appt until contacted with pt)

## 2018-06-18 NOTE — Telephone Encounter (Signed)
Amy Owens- can you offer virtual visit please?

## 2018-06-18 NOTE — Telephone Encounter (Signed)
Copied from CRM [id number]. Topic: Quick Communication - Appointment Cancellation >> Jun 18, 2018 11:11 AM Reggie Pile, NT wrote: Patient's son called to cancel appointment scheduled for 4/27. Patient has not rescheduled their appointment but plans to call back in 1-2 months for in person visit.  Route to department's PEC pool.

## 2018-06-20 NOTE — Telephone Encounter (Signed)
Pt's son returned call. Son says that they would like to cancel apt for now and will call back at a later time to reschedule.

## 2018-06-21 NOTE — Telephone Encounter (Signed)
Appt cancelled

## 2018-06-24 ENCOUNTER — Encounter: Payer: BLUE CROSS/BLUE SHIELD | Admitting: Internal Medicine

## 2018-09-17 ENCOUNTER — Other Ambulatory Visit: Payer: Self-pay | Admitting: Internal Medicine

## 2018-10-02 ENCOUNTER — Encounter: Payer: Self-pay | Admitting: Internal Medicine

## 2018-11-10 ENCOUNTER — Other Ambulatory Visit: Payer: Self-pay | Admitting: Internal Medicine

## 2018-11-10 DIAGNOSIS — E785 Hyperlipidemia, unspecified: Secondary | ICD-10-CM

## 2018-11-23 ENCOUNTER — Other Ambulatory Visit: Payer: Self-pay | Admitting: Internal Medicine

## 2018-12-11 ENCOUNTER — Other Ambulatory Visit: Payer: Self-pay | Admitting: Internal Medicine

## 2018-12-11 DIAGNOSIS — E785 Hyperlipidemia, unspecified: Secondary | ICD-10-CM

## 2018-12-24 ENCOUNTER — Other Ambulatory Visit: Payer: Self-pay | Admitting: Internal Medicine

## 2019-01-01 ENCOUNTER — Other Ambulatory Visit: Payer: Self-pay | Admitting: Internal Medicine

## 2019-01-24 ENCOUNTER — Other Ambulatory Visit: Payer: Self-pay | Admitting: Internal Medicine

## 2019-01-28 ENCOUNTER — Ambulatory Visit (INDEPENDENT_AMBULATORY_CARE_PROVIDER_SITE_OTHER): Payer: 59 | Admitting: Internal Medicine

## 2019-01-28 ENCOUNTER — Other Ambulatory Visit: Payer: Self-pay

## 2019-01-28 ENCOUNTER — Encounter: Payer: Self-pay | Admitting: Internal Medicine

## 2019-01-28 VITALS — BP 157/69 | HR 52 | Temp 96.6°F | Resp 16 | Ht 60.0 in | Wt 156.1 lb

## 2019-01-28 DIAGNOSIS — R739 Hyperglycemia, unspecified: Secondary | ICD-10-CM | POA: Diagnosis not present

## 2019-01-28 DIAGNOSIS — Z0000 Encounter for general adult medical examination without abnormal findings: Secondary | ICD-10-CM | POA: Diagnosis not present

## 2019-01-28 DIAGNOSIS — E785 Hyperlipidemia, unspecified: Secondary | ICD-10-CM

## 2019-01-28 DIAGNOSIS — Z23 Encounter for immunization: Secondary | ICD-10-CM | POA: Diagnosis not present

## 2019-01-28 MED ORDER — CLONIDINE HCL 0.2 MG PO TABS: 0.2000 mg | ORAL_TABLET | Freq: Every day | ORAL | 1 refills | Status: DC

## 2019-01-28 MED ORDER — TRIAMTERENE-HCTZ 37.5-25 MG PO CAPS: 1.0000 | ORAL_CAPSULE | Freq: Every morning | ORAL | 1 refills | Status: DC

## 2019-01-28 MED ORDER — OMEPRAZOLE 40 MG PO CPDR: 40.0000 mg | DELAYED_RELEASE_CAPSULE | Freq: Every day | ORAL | 1 refills | Status: DC

## 2019-01-28 MED ORDER — OLMESARTAN MEDOXOMIL 20 MG PO TABS: 20.0000 mg | ORAL_TABLET | Freq: Every day | ORAL | 1 refills | Status: DC

## 2019-01-28 MED ORDER — ESZOPICLONE 3 MG PO TABS: 3.0000 mg | ORAL_TABLET | Freq: Every day | ORAL | 1 refills | Status: DC

## 2019-01-28 MED ORDER — METOPROLOL TARTRATE 100 MG PO TABS: ORAL_TABLET | ORAL | 1 refills | Status: DC

## 2019-01-28 MED ORDER — FLUOXETINE HCL 20 MG PO TABS: 30.0000 mg | ORAL_TABLET | Freq: Every day | ORAL | 1 refills | Status: DC

## 2019-01-28 MED ORDER — ROSUVASTATIN CALCIUM 10 MG PO TABS: 10.0000 mg | ORAL_TABLET | Freq: Every day | ORAL | 1 refills | Status: DC

## 2019-01-28 NOTE — Progress Notes (Signed)
Subjective:    Patient ID: Amy Owens, female    DOB: 07-01-1955, 63 y.o.   MRN: [id number]  DOS:  01/28/2019 Type of visit - description: CPX No major concerns.  Actually feeling well. She ran out of few medications about 3 weeks ago.    Review of Systems A 14 point review of systems is negative    Past Medical History:  Diagnosis Date   Anxiety    Chest pain    atypical, neg stress test 2009   DM (diabetes mellitus) ([place])    borderline    Hyperlipidemia    Hypertension    Insomnia    sleep disorder unspec   Migraine headache    Perimenopausal     Past Surgical History:  Procedure Laterality Date   CESAREAN SECTION     COLONOSCOPY W/ POLYPECTOMY  2006   KNEE ARTHROSCOPY  2007   TUBAL LIGATION      Social History   Socioeconomic History   Marital status: Married    Spouse name: Not on file   Number of children: 3   Years of education: Not on file   Highest education level: Not on file  Occupational History   Occupation: stay home   Social Needs   Financial resource strain: Not on file   Food insecurity    Worry: Not on file    Inability: Not on file   Transportation needs    Medical: Not on file    Non-medical: Not on file  Tobacco Use   Smoking status: Never Smoker   Smokeless tobacco: Never Used  Substance and Sexual Activity   Alcohol use: No   Drug use: No   Sexual activity: Not on file  Lifestyle   Physical activity    Days per week: Not on file    Minutes per session: Not on file   Stress: Not on file  Relationships   Social connections    Talks on phone: Not on file    Gets together: Not on file    Attends religious service: Not on file    Active member of club or organization: Not on file    Attends meetings of clubs or organizations: Not on file    Relationship status: Not on file   Intimate partner violence    Fear of current or ex partner: Not on file    Emotionally abused: Not on  file    Physically abused: Not on file    Forced sexual activity: Not on file  Other Topics Concern   Not on file  Social History Narrative   Original from [place].    3 children, finished college, all working     2 children at home for the moment     Family History  Problem Relation Age of Onset   Coronary artery disease Mother        44s   Diabetes Mother    Coronary artery disease Father        48s   Diabetes Sister    Diabetes Brother    Diabetes Brother    Colon cancer Neg Hx    Breast cancer Neg Hx      Allergies as of 01/28/2019   No Known Allergies     Medication List       Accurate as of January 28, 2019 11:59 PM. If you have any questions, ask your nurse or doctor.        aspirin 81 MG chewable  tablet Commonly known as: Baby Aspirin Chew 1 tablet (81 mg total) by mouth daily.   cloNIDine 0.2 MG tablet Commonly known as: CATAPRES Take 1 tablet (0.2 mg total) by mouth daily.   esomeprazole 40 MG capsule Commonly known as: NEXIUM Take 1 capsule (40 mg total) by mouth daily before breakfast.   Eszopiclone 3 MG Tabs Take 1 tablet (3 mg total) by mouth at bedtime.   FLUoxetine 20 MG tablet Commonly known as: PROZAC Take 1.5 tablets (30 mg total) by mouth daily.   metoprolol tartrate 100 MG tablet Commonly known as: LOPRESSOR TAKE 1 TABLET BY MOUTH EVERY MORNING AND 1/2 TABLET EVERY EVENING   olmesartan 20 MG tablet Commonly known as: BENICAR Take 1 tablet (20 mg total) by mouth daily.   rosuvastatin 10 MG tablet Commonly known as: CRESTOR Take 1 tablet (10 mg total) by mouth daily.   triamterene-hydrochlorothiazide 37.5-25 MG capsule Commonly known as: DYAZIDE Take 1 each (1 capsule total) by mouth every morning.           Objective:   Physical Exam BP (!) 157/69 (BP Location: Left Arm, Patient Position: Sitting, Cuff Size: Normal)    Pulse (!) 52    Temp (!) 96.6 F (35.9 C) (Temporal)    Resp 16    Ht 5' (1.524 m)    Wt  156 lb 2 oz (70.8 kg)    SpO2 99%    BMI 30.49 kg/m   General: Well developed, NAD, BMI noted Neck: No  thyromegaly  HEENT:  Normocephalic . Face symmetric, atraumatic Lungs:  CTA B Normal respiratory effort, no intercostal retractions, no accessory muscle use. Heart: RRR,  no murmur.  No pretibial edema bilaterally  Abdomen:  Not distended, soft, non-tender. No rebound or rigidity.   Skin: Exposed areas without rash. Not pale. Not jaundice Neurologic:  alert & oriented X3.  Speech normal, gait appropriate for age and unassisted Strength symmetric and appropriate for age.  Psych: Cognition and judgment appear intact.  Cooperative with normal attention span and concentration.  Behavior appropriate. No anxious or depressed appearing.     Assessment     Assessment prediabetes  HTN Hyperlipidemia anxiety, insomnia: --Intolerant to [place] 2014 (HAs), lunesta ok  --rx prozac 01-2015, good results H/o  migraines H/o CP (-) stress test 2009 +FH CAD, DM   PLAN Here for CPX Prediabetes: Check A1c HTN: BP slightly elevated, she is on metoprolol, Dyazide but ran out of clonidine and olmesartan.  Will RF all medications and do labs in 3 weeks Hyperlipidemia: Out of Crestor for 3 weeks, refill, labs in 3 weeks GERD: nexium not covered, change to omeprazole (apparently insurance is not covering PPIs, will have to take PPI of her choice and pay out-of-pocket) RTC fasting in 3 weeks for labs RTC in 6 months mostly to recheck blood pressure    This visit occurred during the SARS-CoV-2 public health emergency.  Safety protocols were in place, including screening questions prior to the visit, additional usage of staff PPE, and extensive cleaning of exam room while observing appropriate contact time as indicated for disinfecting solutions.

## 2019-01-28 NOTE — Patient Instructions (Addendum)
GO TO THE FRONT DESK Schedule your next appointment   for blood work in 3 weeks, fasting  Schedule a office visit with me in 6 months   Check the  blood pressure 2 weeks BP GOAL is between 110/65 and  135/85. If it is consistently higher or lower, let me know

## 2019-01-28 NOTE — Progress Notes (Signed)
Pre visit review using our clinic review tool, if applicable. No additional management support is needed unless otherwise documented below in the visit note.   Late addendum- 01/29/2019 at [place]- Received fax from [place] they do not cover omeprazole 40mg - preferred is esomeprazole- okay per Dr. Larose Kells to switch back.

## 2019-01-29 ENCOUNTER — Telehealth: Payer: Self-pay

## 2019-01-29 MED ORDER — ESOMEPRAZOLE MAGNESIUM 40 MG PO CPDR: 40.0000 mg | DELAYED_RELEASE_CAPSULE | Freq: Every day | ORAL | 1 refills | Status: DC

## 2019-01-29 NOTE — Telephone Encounter (Signed)
Received PA for esomeprazole and omeprazole- called Pt's plan- [employer] [employer] at [phone]- spoke w/ [place]-Pt's plan actually does not cover any PPI, this is a plan exclusion. Please advise.

## 2019-01-30 ENCOUNTER — Encounter: Payer: Self-pay | Admitting: Internal Medicine

## 2019-01-30 NOTE — Telephone Encounter (Signed)
Okay, I will send a note to the patient

## 2019-01-30 NOTE — Assessment & Plan Note (Signed)
--  Td 2014 - zostavax 2014 - PNM 13: today - shingrex at the next oportunity  - flu shot today  -- female care: PAP 12/2016; MMG 11/2016.  Declined referral to gynecology or a mammogram.  States she will call --Cscope 2006: tubular adenomas, Cscope ahgain 06-2011, 1 polyp, not recovered; cscope 11-2016: wnl, next 5 years, see report   -- diet-exercise: Eating well, healthy.  Not exercising, explaining benefits of physical activity.  She does not seem to be motivated. --T Score 11-2017: -1.1. --labs:  Will come back fasting for CMP, FLP, CBC, A1c, TSH

## 2019-01-30 NOTE — Assessment & Plan Note (Signed)
Here for CPX Prediabetes: Check A1c HTN: BP slightly elevated, she is on metoprolol, Dyazide but ran out of clonidine and olmesartan.  Will RF all medications and do labs in 3 weeks Hyperlipidemia: Out of Crestor for 3 weeks, refill, labs in 3 weeks GERD: nexium not covered, change to omeprazole (apparently insurance is not covering PPIs, will have to take PPI of her choice and pay out-of-pocket) RTC fasting in 3 weeks for labs RTC in 6 months mostly to recheck blood pressure

## 2019-02-24 ENCOUNTER — Other Ambulatory Visit (INDEPENDENT_AMBULATORY_CARE_PROVIDER_SITE_OTHER): Payer: 59

## 2019-02-24 ENCOUNTER — Other Ambulatory Visit: Payer: Self-pay

## 2019-02-24 DIAGNOSIS — Z0000 Encounter for general adult medical examination without abnormal findings: Secondary | ICD-10-CM

## 2019-02-24 DIAGNOSIS — E785 Hyperlipidemia, unspecified: Secondary | ICD-10-CM | POA: Diagnosis not present

## 2019-02-24 DIAGNOSIS — R739 Hyperglycemia, unspecified: Secondary | ICD-10-CM

## 2019-02-24 LAB — COMPREHENSIVE METABOLIC PANEL
ALT: 21 U/L (ref 0–35)
AST: 14 U/L (ref 0–37)
Albumin: 4.5 g/dL (ref 3.5–5.2)
Alkaline Phosphatase: 70 U/L (ref 39–117)
BUN: 31 mg/dL — ABNORMAL HIGH (ref 6–23)
CO2: 31 mEq/L (ref 19–32)
Calcium: 9.6 mg/dL (ref 8.4–10.5)
Chloride: 104 mEq/L (ref 96–112)
Creatinine, Ser: 1.1 mg/dL (ref 0.40–1.20)
GFR: 49.81 mL/min — ABNORMAL LOW (ref >60.00)
Glucose, Bld: 122 mg/dL — ABNORMAL HIGH (ref 70–99)
Potassium: 4.1 mEq/L (ref 3.5–5.1)
Sodium: 142 mEq/L (ref 135–145)
Total Bilirubin: 0.3 mg/dL (ref 0.2–1.2)
Total Protein: 7 g/dL (ref 6.0–8.3)

## 2019-02-24 LAB — CBC WITH DIFFERENTIAL/PLATELET
Basophils Absolute: 0 10*3/uL (ref 0.0–0.1)
Basophils Relative: 0.7 % (ref 0.0–3.0)
Eosinophils Absolute: 0.4 10*3/uL (ref 0.0–0.7)
Eosinophils Relative: 5.5 % — ABNORMAL HIGH (ref 0.0–5.0)
HCT: 39.7 % (ref 36.0–46.0)
Hemoglobin: 13.1 g/dL (ref 12.0–15.0)
Lymphocytes Relative: 35.8 % (ref 12.0–46.0)
Lymphs Abs: 2.4 10*3/uL (ref 0.7–4.0)
MCHC: 32.9 g/dL (ref 30.0–36.0)
MCV: 94.1 fl (ref 78.0–100.0)
Monocytes Absolute: 0.7 10*3/uL (ref 0.1–1.0)
Monocytes Relative: 10.8 % (ref 3.0–12.0)
Neutro Abs: 3.2 10*3/uL (ref 1.4–7.7)
Neutrophils Relative %: 47.2 % (ref 43.0–77.0)
Platelets: 180 10*3/uL (ref 150.0–400.0)
RBC: 4.22 Mil/uL (ref 3.87–5.11)
RDW: 12.7 % (ref 11.5–15.5)
WBC: 6.7 10*3/uL (ref 4.0–10.5)

## 2019-02-24 LAB — LIPID PANEL
Cholesterol: 180 mg/dL (ref 0–200)
HDL: 45.4 mg/dL (ref >39.00)
NonHDL: 134.55
Total CHOL/HDL Ratio: 4
Triglycerides: 204 mg/dL — ABNORMAL HIGH (ref 0.0–149.0)
VLDL: 40.8 mg/dL — ABNORMAL HIGH (ref 0.0–40.0)

## 2019-02-24 LAB — HEMOGLOBIN A1C: Hgb A1c MFr Bld: 5.8 % (ref 4.6–6.5)

## 2019-02-24 LAB — LDL CHOLESTEROL, DIRECT: Direct LDL: 92 mg/dL

## 2019-02-24 LAB — TSH: TSH: 3.15 u[IU]/mL (ref 0.35–4.50)

## 2019-02-27 ENCOUNTER — Other Ambulatory Visit: Payer: Self-pay | Admitting: Internal Medicine

## 2019-07-24 ENCOUNTER — Other Ambulatory Visit: Payer: Self-pay | Admitting: Internal Medicine

## 2019-07-29 ENCOUNTER — Encounter: Payer: Self-pay | Admitting: Internal Medicine

## 2019-07-29 ENCOUNTER — Other Ambulatory Visit: Payer: Self-pay

## 2019-07-29 ENCOUNTER — Ambulatory Visit (INDEPENDENT_AMBULATORY_CARE_PROVIDER_SITE_OTHER): Payer: Medicare HMO | Admitting: Internal Medicine

## 2019-07-29 VITALS — BP 139/63 | HR 52 | Temp 97.8°F | Resp 16 | Ht 60.0 in | Wt 153.0 lb

## 2019-07-29 DIAGNOSIS — I10 Essential (primary) hypertension: Secondary | ICD-10-CM

## 2019-07-29 DIAGNOSIS — G4700 Insomnia, unspecified: Secondary | ICD-10-CM | POA: Diagnosis not present

## 2019-07-29 LAB — BASIC METABOLIC PANEL
BUN: 25 mg/dL — ABNORMAL HIGH (ref 6–23)
CO2: 30 mEq/L (ref 19–32)
Calcium: 9.7 mg/dL (ref 8.4–10.5)
Chloride: 105 mEq/L (ref 96–112)
Creatinine, Ser: 1.03 mg/dL (ref 0.40–1.20)
GFR: 53.66 mL/min — ABNORMAL LOW (ref >60.00)
Glucose, Bld: 103 mg/dL — ABNORMAL HIGH (ref 70–99)
Potassium: 4.6 mEq/L (ref 3.5–5.1)
Sodium: 143 mEq/L (ref 135–145)

## 2019-07-29 MED ORDER — ESZOPICLONE 3 MG PO TABS: 3.0000 mg | ORAL_TABLET | Freq: Every day | ORAL | 1 refills | Status: DC

## 2019-07-29 NOTE — Patient Instructions (Signed)
Continue checking your blood pressures BP GOAL is between 110/65 and  135/85. If it is consistently higher or lower, let me know   GO TO THE LAB : Get the blood work     GO TO THE FRONT DESK, PLEASE SCHEDULE YOUR APPOINTMENTS Come back for a physical exam, fasting by December 2022

## 2019-07-29 NOTE — Assessment & Plan Note (Signed)
Prediabetes: Last A1c excellent. HTN: Controlled, continue clonidine, metoprolol, Benicar, Dyazide.  Check a BMP Anxiety, insomnia: Refill Lunesta. GERD: Unable to get prescription strength PPIs from her insurance, on OTCs, symptoms are relatively controlled. Preventive care: Had a Covid shot RTC 6 months CPX

## 2019-07-29 NOTE — Progress Notes (Signed)
Subjective:    Patient ID: Amy Owens, female    DOB: Dec 16, 1955, 64 y.o.   MRN: [id number]  DOS:  07/29/2019 Type of visit - description: Follow-up Since the last office visit, she is doing well. Today we talk about hypertension, GERD.   Review of Systems Denies fever chills or weight loss No chest pain or difficulty breathing Has a chronic on and off cough, at baseline  Past Medical History:  Diagnosis Date  . Anxiety   . Chest pain    atypical, neg stress test 2009  . DM (diabetes mellitus) (HCC)    borderline   . Hyperlipidemia   . Hypertension   . Insomnia    sleep disorder unspec  . Migraine headache   . Perimenopausal     Past Surgical History:  Procedure Laterality Date  . CESAREAN SECTION    . COLONOSCOPY W/ POLYPECTOMY  2006  . KNEE ARTHROSCOPY  2007  . TUBAL LIGATION      Allergies as of 07/29/2019   No Known Allergies     Medication List       Accurate as of July 29, 2019 10:02 AM. If you have any questions, ask your nurse or doctor.        aspirin 81 MG chewable tablet Commonly known as: Baby Aspirin Chew 1 tablet (81 mg total) by mouth daily.   cloNIDine 0.2 MG tablet Commonly known as: CATAPRES Take 1 tablet (0.2 mg total) by mouth daily.   esomeprazole 40 MG capsule Commonly known as: NEXIUM Take 1 capsule (40 mg total) by mouth daily before breakfast.   Eszopiclone 3 MG Tabs Take 1 tablet (3 mg total) by mouth at bedtime.   FLUoxetine 20 MG tablet Commonly known as: PROZAC Take 1.5 tablets (30 mg total) by mouth daily.   metoprolol tartrate 100 MG tablet Commonly known as: LOPRESSOR TAKE 1 TABLET BY MOUTH EVERY MORNING AND 1/2 TABLET EVERY EVENING   olmesartan 20 MG tablet Commonly known as: BENICAR Take 1 tablet (20 mg total) by mouth daily.   rosuvastatin 10 MG tablet Commonly known as: CRESTOR Take 1 tablet (10 mg total) by mouth daily.   triamterene-hydrochlorothiazide 37.5-25 MG capsule Commonly known  as: DYAZIDE Take 1 each (1 capsule total) by mouth every morning.          Objective:   Physical Exam BP 139/63 (BP Location: Left Arm, Patient Position: Sitting, Cuff Size: Small)   Pulse (!) 52   Temp 97.8 F (36.6 C) (Temporal)   Resp 16   Ht 5' (1.524 m)   Wt 153 lb (69.4 kg)   SpO2 99%   BMI 29.88 kg/m  General:   Well developed, NAD, BMI noted. HEENT:  Normocephalic . Face symmetric, atraumatic Lungs:  CTA B Normal respiratory effort, no intercostal retractions, no accessory muscle use. Heart: RRR,  no murmur.  Lower extremities: no pretibial edema bilaterally  Skin: Not pale. Not jaundice Neurologic:  alert & oriented X3.  Speech normal, gait appropriate for age and unassisted Psych--  Cognition and judgment appear intact.  Cooperative with normal attention span and concentration.  Behavior appropriate. No anxious or depressed appearing.      Assessment      Assessment prediabetes  HTN Hyperlipidemia anxiety, insomnia: --Intolerant to [place] 2014 (HAs), lunesta ok  --rx prozac 01-2015, good results H/o  migraines H/o CP (-) stress test 2009 +FH CAD, DM   PLAN Prediabetes: Last A1c excellent. HTN: Controlled, continue clonidine, metoprolol, Benicar, Dyazide.  Check a BMP Anxiety, insomnia: Refill Lunesta. GERD: Unable to get prescription strength PPIs from her insurance, on OTCs, symptoms are relatively controlled. Preventive care: Had a Covid shot RTC 6 months CPX  This visit occurred during the SARS-CoV-2 public health emergency.  Safety protocols were in place, including screening questions prior to the visit, additional usage of staff PPE, and extensive cleaning of exam room while observing appropriate contact time as indicated for disinfecting solutions.

## 2019-07-29 NOTE — Progress Notes (Signed)
Pre visit review using our clinic review tool, if applicable. No additional management support is needed unless otherwise documented below in the visit note. 

## 2019-08-03 ENCOUNTER — Other Ambulatory Visit: Payer: Self-pay | Admitting: Internal Medicine

## 2019-08-03 DIAGNOSIS — E785 Hyperlipidemia, unspecified: Secondary | ICD-10-CM

## 2019-08-24 ENCOUNTER — Other Ambulatory Visit: Payer: Self-pay | Admitting: Internal Medicine

## 2019-09-02 DIAGNOSIS — M25571 Pain in right ankle and joints of right foot: Secondary | ICD-10-CM | POA: Diagnosis not present

## 2019-09-12 ENCOUNTER — Encounter: Payer: Self-pay | Admitting: Internal Medicine

## 2019-12-28 DIAGNOSIS — R69 Illness, unspecified: Secondary | ICD-10-CM | POA: Diagnosis not present

## 2020-01-26 ENCOUNTER — Other Ambulatory Visit: Payer: Self-pay | Admitting: Internal Medicine

## 2020-01-26 DIAGNOSIS — Z1231 Encounter for screening mammogram for malignant neoplasm of breast: Secondary | ICD-10-CM

## 2020-01-27 ENCOUNTER — Other Ambulatory Visit: Payer: Self-pay

## 2020-01-27 ENCOUNTER — Ambulatory Visit
Admission: RE | Admit: 2020-01-27 | Discharge: 2020-01-27 | Disposition: A | Discharge status: Home / Self Care | Payer: Medicare HMO | Source: Ambulatory Visit | Attending: Internal Medicine | Admitting: Internal Medicine

## 2020-01-27 ENCOUNTER — Other Ambulatory Visit: Payer: Self-pay | Admitting: Internal Medicine

## 2020-01-27 DIAGNOSIS — Z1231 Encounter for screening mammogram for malignant neoplasm of breast: Secondary | ICD-10-CM | POA: Diagnosis not present

## 2020-01-28 ENCOUNTER — Ambulatory Visit (INDEPENDENT_AMBULATORY_CARE_PROVIDER_SITE_OTHER): Payer: Medicare HMO | Admitting: Internal Medicine

## 2020-01-28 ENCOUNTER — Encounter: Payer: Self-pay | Admitting: Internal Medicine

## 2020-01-28 VITALS — BP 138/78 | HR 60 | Temp 98.0°F | Resp 16 | Ht 60.0 in | Wt 150.1 lb

## 2020-01-28 DIAGNOSIS — I10 Essential (primary) hypertension: Secondary | ICD-10-CM

## 2020-01-28 DIAGNOSIS — Z23 Encounter for immunization: Secondary | ICD-10-CM | POA: Diagnosis not present

## 2020-01-28 DIAGNOSIS — Z01419 Encounter for gynecological examination (general) (routine) without abnormal findings: Secondary | ICD-10-CM

## 2020-01-28 DIAGNOSIS — Z0000 Encounter for general adult medical examination without abnormal findings: Secondary | ICD-10-CM

## 2020-01-28 DIAGNOSIS — R739 Hyperglycemia, unspecified: Secondary | ICD-10-CM | POA: Diagnosis not present

## 2020-01-28 DIAGNOSIS — E785 Hyperlipidemia, unspecified: Secondary | ICD-10-CM | POA: Diagnosis not present

## 2020-01-28 LAB — CBC WITH DIFFERENTIAL/PLATELET
Basophils Absolute: 0 10*3/uL (ref 0.0–0.1)
Basophils Relative: 0.6 % (ref 0.0–3.0)
Eosinophils Absolute: 0.3 10*3/uL (ref 0.0–0.7)
Eosinophils Relative: 3.9 % (ref 0.0–5.0)
HCT: 40.3 % (ref 36.0–46.0)
Hemoglobin: 13.5 g/dL (ref 12.0–15.0)
Lymphocytes Relative: 30 % (ref 12.0–46.0)
Lymphs Abs: 2.1 10*3/uL (ref 0.7–4.0)
MCHC: 33.4 g/dL (ref 30.0–36.0)
MCV: 92.4 fl (ref 78.0–100.0)
Monocytes Absolute: 0.6 10*3/uL (ref 0.1–1.0)
Monocytes Relative: 8.4 % (ref 3.0–12.0)
Neutro Abs: 3.9 10*3/uL (ref 1.4–7.7)
Neutrophils Relative %: 57.1 % (ref 43.0–77.0)
Platelets: 202 10*3/uL (ref 150.0–400.0)
RBC: 4.36 Mil/uL (ref 3.87–5.11)
RDW: 12.5 % (ref 11.5–15.5)
WBC: 6.9 10*3/uL (ref 4.0–10.5)

## 2020-01-28 LAB — COMPREHENSIVE METABOLIC PANEL
ALT: 23 U/L (ref 0–35)
AST: 23 U/L (ref 0–37)
Albumin: 4.7 g/dL (ref 3.5–5.2)
Alkaline Phosphatase: 60 U/L (ref 39–117)
BUN: 25 mg/dL — ABNORMAL HIGH (ref 6–23)
CO2: 33 mEq/L — ABNORMAL HIGH (ref 19–32)
Calcium: 10.1 mg/dL (ref 8.4–10.5)
Chloride: 101 mEq/L (ref 96–112)
Creatinine, Ser: 1.03 mg/dL (ref 0.40–1.20)
GFR: 56.79 mL/min — ABNORMAL LOW (ref >60.00)
Glucose, Bld: 99 mg/dL (ref 70–99)
Potassium: 4.7 mEq/L (ref 3.5–5.1)
Sodium: 140 mEq/L (ref 135–145)
Total Bilirubin: 0.5 mg/dL (ref 0.2–1.2)
Total Protein: 7.3 g/dL (ref 6.0–8.3)

## 2020-01-28 LAB — LIPID PANEL
Cholesterol: 187 mg/dL (ref 0–200)
HDL: 53.2 mg/dL (ref >39.00)
LDL Cholesterol: 104 mg/dL — ABNORMAL HIGH (ref 0–99)
NonHDL: 133.48
Total CHOL/HDL Ratio: 4
Triglycerides: 147 mg/dL (ref 0.0–149.0)
VLDL: 29.4 mg/dL (ref 0.0–40.0)

## 2020-01-28 LAB — HEMOGLOBIN A1C: Hgb A1c MFr Bld: 5.8 % (ref 4.6–6.5)

## 2020-01-28 LAB — TSH: TSH: 2.26 u[IU]/mL (ref 0.35–4.50)

## 2020-01-28 MED ORDER — ESZOPICLONE 3 MG PO TABS
3.0000 mg | ORAL_TABLET | Freq: Every day | ORAL | 1 refills | Status: DC
Start: 2020-01-28 — End: 2020-11-17

## 2020-01-28 NOTE — Progress Notes (Signed)
Pre visit review using our clinic review tool, if applicable. No additional management support is needed unless otherwise documented below in the visit note. 

## 2020-01-28 NOTE — Progress Notes (Signed)
Subjective:    Patient ID: Amy Owens, female    DOB: [birth year], 64 y.o.   MRN: [id number]  DOS:  01/28/2020 Type of visit - description:   CPX Here for CPX, in general feels well, has no major concerns.   Review of Systems Denies chest pain or difficulty breathing. Has occasional nausea but no acid reflux symptoms, no dysphagia or odynophagia. Occasionally feels dizzy for few seconds if she stands up quickly.   Other than above, a 14 point review of systems is negative      Past Medical History:  Diagnosis Date  . Anxiety   . Chest pain    atypical, neg stress test 2009  . DM (diabetes mellitus) (HCC)    borderline   . Hyperlipidemia   . Hypertension   . Insomnia    sleep disorder unspec  . Migraine headache   . Perimenopausal     Past Surgical History:  Procedure Laterality Date  . CESAREAN SECTION    . COLONOSCOPY W/ POLYPECTOMY  2006  . KNEE ARTHROSCOPY  2007  . TUBAL LIGATION      Allergies as of 01/28/2020   No Known Allergies     Medication List       Accurate as of January 28, 2020 11:59 PM. If you have any questions, ask your nurse or doctor.        aspirin 81 MG chewable tablet Commonly known as: Baby Aspirin Chew 1 tablet (81 mg total) by mouth daily.   cloNIDine 0.2 MG tablet Commonly known as: CATAPRES Take 1 tablet (0.2 mg total) by mouth daily.   Eszopiclone 3 MG Tabs Take 1 tablet (3 mg total) by mouth at bedtime.   FLUoxetine 20 MG tablet Commonly known as: PROZAC Take 1.5 tablets (30 mg total) by mouth daily.   metoprolol tartrate 100 MG tablet Commonly known as: LOPRESSOR TAKE 1 TABLET BY MOUTH EVERY MORNING AND TAKE 1/2 TABLET EVERY EVENING   olmesartan 20 MG tablet Commonly known as: BENICAR Take 1 tablet (20 mg total) by mouth daily.   PriLOSEC OTC 20 MG tablet Generic drug: omeprazole Take 1 tablet (20 mg total) by mouth daily.   rosuvastatin 10 MG tablet Commonly known as: CRESTOR Take 1 tablet  (10 mg total) by mouth daily.   triamterene-hydrochlorothiazide 37.5-25 MG capsule Commonly known as: DYAZIDE Take 1 each (1 capsule total) by mouth every morning.          Objective:   Physical Exam BP 138/78 (BP Location: Right Arm, Patient Position: Sitting, Cuff Size: Small)   Pulse 60   Temp 98 F (36.7 C) (Oral)   Resp 16   Ht 5' (1.524 m)   Wt 150 lb 2 oz (68.1 kg)   SpO2 97%   BMI 29.32 kg/m  General: Well developed, NAD, BMI noted Neck: No  thyromegaly  HEENT:  Normocephalic . Face symmetric, atraumatic Lungs:  CTA B Normal respiratory effort, no intercostal retractions, no accessory muscle use. Heart: RRR,  no murmur.  Abdomen:  Not distended, soft, non-tender. No rebound or rigidity.   Lower extremities: no pretibial edema bilaterally  Skin: Exposed areas without rash. Not pale. Not jaundice Neurologic:  alert & oriented X3.  Speech normal, gait appropriate for age and unassisted Strength symmetric and appropriate for age.  Psych: Cognition and judgment appear intact.  Cooperative with normal attention span and concentration.  Behavior appropriate. No anxious or depressed appearing.     Assessment  Assessment prediabetes  HTN Hyperlipidemia GERD anxiety, insomnia: --Intolerant to [place] 2014 (HAs), lunesta ok  --rx prozac 01-2015, good results H/o  migraines H/o CP (-) stress test 2009 +FH CAD, DM   PLAN Here for CPX Prediabetes, check A1c, encouraged a healthy lifestyle. HTN: Ambulatory BPs very good, never more than 130s.  On clonidine, metoprolol, Benicar, Dyazide.  Checking labs. High cholesterol: On Crestor, labs. Insomnia: Controlled on eszopiclone, contract signed.  RF sent. Social: Patient lives in [place], husband works in [place] most of the time. RTC 8 months.     This visit occurred during the SARS-CoV-2 public health emergency.  Safety protocols were in place, including screening questions prior to the visit,  additional usage of staff PPE, and extensive cleaning of exam room while observing appropriate contact time as indicated for disinfecting solutions.

## 2020-01-28 NOTE — Patient Instructions (Addendum)
Check the  blood pressure regularly. BP GOAL is between 110/65 and  135/85. If it is consistently higher or lower, let me know  Please consider get Shingrix, a vaccination against shingles at your pharmacy  We are referring you to a gynecologist in this building  GO TO THE LAB : Get the blood work     GO TO THE FRONT DESK, PLEASE SCHEDULE YOUR APPOINTMENTS Come back for a checkup in 8 months

## 2020-01-30 ENCOUNTER — Encounter: Payer: Self-pay | Admitting: Internal Medicine

## 2020-01-30 ENCOUNTER — Other Ambulatory Visit: Payer: Self-pay | Admitting: Internal Medicine

## 2020-01-30 NOTE — Assessment & Plan Note (Signed)
--  Td 2014 - zostavax 2014;  Shingrex: recommended - PNM 13: 2020 ; PNM 23 today - s/p covid vax x 2 plus booster  - had a flu shot   -- female care: PAP 12/2016, needs a new referral, will do; [facility] 12/2019 (-)  --Cscope 2006: tubular adenomas, Cscope ahgain 06-2011, 1 polyp, not recovered; cscope 11-2016: wnl, next 5 years, see report   -- diet-exercise: Eating well, healthy.  Encourage to take a walk daily. --T Score 11-2017: -1.1. --labs:  CMP, FLP, CBC, A1c, TSH.

## 2020-01-30 NOTE — Assessment & Plan Note (Signed)
Here for CPX Prediabetes, check A1c, encouraged a healthy lifestyle. HTN: Ambulatory BPs very good, never more than 130s.  On clonidine, metoprolol, Benicar, Dyazide.  Checking labs. High cholesterol: On Crestor, labs. Insomnia: Controlled on eszopiclone, contract signed.  RF sent. Social: Patient lives in [place], husband works in [place] most of the time. RTC 8 months.

## 2020-02-03 ENCOUNTER — Ambulatory Visit (INDEPENDENT_AMBULATORY_CARE_PROVIDER_SITE_OTHER): Payer: Medicare HMO

## 2020-02-03 ENCOUNTER — Ambulatory Visit: Payer: Medicare HMO | Admitting: Podiatry

## 2020-02-03 ENCOUNTER — Other Ambulatory Visit: Payer: Self-pay | Admitting: Internal Medicine

## 2020-02-03 ENCOUNTER — Other Ambulatory Visit: Payer: Self-pay

## 2020-02-03 ENCOUNTER — Encounter: Payer: Self-pay | Admitting: Podiatry

## 2020-02-03 DIAGNOSIS — M778 Other enthesopathies, not elsewhere classified: Secondary | ICD-10-CM

## 2020-02-03 DIAGNOSIS — M2012 Hallux valgus (acquired), left foot: Secondary | ICD-10-CM

## 2020-02-03 MED ORDER — DEXAMETHASONE SODIUM PHOSPHATE 120 MG/30ML IJ SOLN
4.0000 mg | Freq: Once | INTRAMUSCULAR | Status: AC
  Administered 2020-02-03: 4 mg via INTRA_ARTICULAR

## 2020-02-03 MED ORDER — MELOXICAM 15 MG PO TABS: 15.0000 mg | ORAL_TABLET | Freq: Every day | ORAL | 3 refills | Status: DC

## 2020-02-03 NOTE — Progress Notes (Signed)
Subjective:  Patient ID: Amy Owens, female    DOB: 28-Apr-1955,  MRN: [id number] HPI Chief Complaint  Patient presents with  . Foot Pain    1st MPJ bilateral (R>L) - aching x 6-7 months, red, swollen, sometimes 5th MPJ right gets irritated, saw Delsa Grana in boot, wore for few weeks and Rx'd NSAID-pain returned  . New Patient (Initial Visit)    64 y.o. female presents with the above complaint.   ROS: Denies fever chills nausea vomiting muscle aches pains calf pain back pain chest pain shortness of breath.  Past Medical History:  Diagnosis Date  . Anxiety   . Chest pain    atypical, neg stress test 2009  . DM (diabetes mellitus) (HCC)    borderline   . Hyperlipidemia   . Hypertension   . Insomnia    sleep disorder unspec  . Migraine headache   . Perimenopausal    Past Surgical History:  Procedure Laterality Date  . CESAREAN SECTION    . COLONOSCOPY W/ POLYPECTOMY  2006  . KNEE ARTHROSCOPY  2007  . TUBAL LIGATION      Current Outpatient Medications:  .  aspirin (BABY ASPIRIN) 81 MG chewable tablet, Chew 1 tablet (81 mg total) by mouth daily., Disp: 90 tablet, Rfl: 0 .  cloNIDine (CATAPRES) 0.2 MG tablet, TAKE 1 TABLET(0.2 MG) BY MOUTH DAILY, Disp: 90 tablet, Rfl: 1 .  Eszopiclone 3 MG TABS, Take 1 tablet (3 mg total) by mouth at bedtime., Disp: 90 tablet, Rfl: 1 .  FLUoxetine (PROZAC) 20 MG tablet, Take 1.5 tablets (30 mg total) by mouth daily., Disp: 135 tablet, Rfl: 1 .  meloxicam (MOBIC) 15 MG tablet, Take 1 tablet (15 mg total) by mouth daily., Disp: 30 tablet, Rfl: 3 .  metoprolol tartrate (LOPRESSOR) 100 MG tablet, TAKE 1 TABLET BY MOUTH EVERY MORNING AND TAKE 1/2 TABLET EVERY EVENING, Disp: 135 tablet, Rfl: 1 .  olmesartan (BENICAR) 20 MG tablet, Take 1 tablet (20 mg total) by mouth daily., Disp: 90 tablet, Rfl: 2 .  omeprazole (PRILOSEC) 40 MG capsule, TAKE 1 CAPSULE(40 MG) BY MOUTH DAILY BEFORE BREAKFAST, Disp: 90 capsule, Rfl: 1 .   rosuvastatin (CRESTOR) 10 MG tablet, Take 1 tablet (10 mg total) by mouth daily., Disp: 90 tablet, Rfl: 2 .  triamterene-hydrochlorothiazide (DYAZIDE) 37.5-25 MG capsule, Take 1 each (1 capsule total) by mouth every morning., Disp: 90 capsule, Rfl: 1  No Known Allergies Review of Systems Objective:  There were no vitals filed for this visit.  General: Well developed, nourished, in no acute distress, alert and oriented x3   Dermatological: Skin is warm, dry and supple bilateral. Nails x 10 are well maintained; remaining integument appears unremarkable at this time. There are no open sores, no preulcerative lesions, no rash or signs of infection present.  Vascular: Dorsalis Pedis artery and Posterior Tibial artery pedal pulses are 2/4 bilateral with immedate capillary fill time. Pedal hair growth present. No varicosities and no lower extremity edema present bilateral.   Neruologic: Grossly intact via light touch bilateral. Vibratory intact via tuning fork bilateral. Protective threshold with Semmes Wienstein monofilament intact to all pedal sites bilateral. Patellar and Achilles deep tendon reflexes 2+ bilateral. No Babinski or clonus noted bilateral.   Musculoskeletal: No gross boney pedal deformities bilateral. No pain, crepitus, or limitation noted with foot and ankle range of motion bilateral. Muscular strength 5/5 in all groups tested bilateral. Pain on palpation in the right and range of motion of the first metatarsophalangeal  joint mild hallux valgus deformities noted bilateral left greater than right.  Gait: Unassisted, Nonantalgic.    Radiographs:  Radiographs taken today demonstrate increase in the first intermetatarsal angle greater than normal value with hypertrophic medial condyle joint space narrowing subchondral eburnation minimal dorsal spurring.  Assessment & Plan:   Assessment: Capsulitis and hallux valgus bilateral. Bilateral.  Plan: After sterile Betadine skin prep I  injected dexamethasone to the first metatarsophalangeal joints bilaterally. I will follow-up with her if this does not resolve her symptoms.     Cagney Degrace T. [place], [place]

## 2020-02-04 ENCOUNTER — Other Ambulatory Visit: Payer: Self-pay | Admitting: Podiatry

## 2020-02-04 DIAGNOSIS — M778 Other enthesopathies, not elsewhere classified: Secondary | ICD-10-CM

## 2020-02-19 ENCOUNTER — Encounter: Payer: Self-pay | Admitting: Internal Medicine

## 2020-02-19 DIAGNOSIS — E785 Hyperlipidemia, unspecified: Secondary | ICD-10-CM

## 2020-02-23 MED ORDER — METOPROLOL TARTRATE 100 MG PO TABS
ORAL_TABLET | ORAL | 1 refills | Status: DC
Start: 2020-02-23 — End: 2020-03-01

## 2020-02-23 MED ORDER — FLUOXETINE HCL 20 MG PO TABS
30.0000 mg | ORAL_TABLET | Freq: Every day | ORAL | 1 refills | Status: DC
Start: 2020-02-23 — End: 2020-11-15

## 2020-02-23 MED ORDER — OLMESARTAN MEDOXOMIL 20 MG PO TABS
20.0000 mg | ORAL_TABLET | Freq: Every day | ORAL | 2 refills | Status: DC
Start: 2020-02-23 — End: 2020-11-15

## 2020-02-23 MED ORDER — CLONIDINE HCL 0.2 MG PO TABS
ORAL_TABLET | ORAL | 1 refills | Status: DC
Start: 2020-02-23 — End: 2020-09-10

## 2020-02-23 MED ORDER — ROSUVASTATIN CALCIUM 10 MG PO TABS: 10.0000 mg | ORAL_TABLET | Freq: Every day | ORAL | 2 refills | Status: DC

## 2020-02-23 MED ORDER — TRIAMTERENE-HCTZ 37.5-25 MG PO CAPS
1.0000 | ORAL_CAPSULE | Freq: Every morning | ORAL | 1 refills | Status: DC
Start: 2020-02-23 — End: 2020-11-15

## 2020-02-23 MED ORDER — OMEPRAZOLE 40 MG PO CPDR
DELAYED_RELEASE_CAPSULE | ORAL | 1 refills | Status: DC
Start: 2020-02-23 — End: 2020-11-15

## 2020-02-27 ENCOUNTER — Other Ambulatory Visit: Payer: Self-pay | Admitting: Internal Medicine

## 2020-04-30 ENCOUNTER — Other Ambulatory Visit: Payer: Self-pay | Admitting: Internal Medicine

## 2020-04-30 DIAGNOSIS — E785 Hyperlipidemia, unspecified: Secondary | ICD-10-CM

## 2020-09-10 ENCOUNTER — Other Ambulatory Visit: Payer: Self-pay | Admitting: Internal Medicine

## 2020-09-28 ENCOUNTER — Ambulatory Visit: Payer: Medicare HMO | Admitting: Internal Medicine

## 2020-11-14 ENCOUNTER — Encounter: Payer: Self-pay | Admitting: Internal Medicine

## 2020-11-14 DIAGNOSIS — E785 Hyperlipidemia, unspecified: Secondary | ICD-10-CM

## 2020-11-15 MED ORDER — OMEPRAZOLE 40 MG PO CPDR: DELAYED_RELEASE_CAPSULE | ORAL | 0 refills | Status: DC

## 2020-11-15 MED ORDER — METOPROLOL TARTRATE 100 MG PO TABS: ORAL_TABLET | ORAL | 0 refills | Status: DC

## 2020-11-15 MED ORDER — ROSUVASTATIN CALCIUM 10 MG PO TABS: 10.0000 mg | ORAL_TABLET | Freq: Every day | ORAL | 0 refills | Status: DC

## 2020-11-15 MED ORDER — FLUOXETINE HCL 20 MG PO TABS: 30.0000 mg | ORAL_TABLET | Freq: Every day | ORAL | 0 refills | Status: DC

## 2020-11-15 MED ORDER — TRIAMTERENE-HCTZ 37.5-25 MG PO CAPS: 1.0000 | ORAL_CAPSULE | Freq: Every morning | ORAL | 0 refills | Status: DC

## 2020-11-15 MED ORDER — OLMESARTAN MEDOXOMIL 20 MG PO TABS: 20.0000 mg | ORAL_TABLET | Freq: Every day | ORAL | 0 refills | Status: DC

## 2020-11-15 MED ORDER — CLONIDINE HCL 0.2 MG PO TABS: 0.2000 mg | ORAL_TABLET | Freq: Every day | ORAL | 0 refills | Status: DC

## 2020-11-15 NOTE — Telephone Encounter (Signed)
Requesting: Eszopiclone 3mg  Contract: 01/28/2020 UDS: None Last Visit: 01/28/2020 Next Visit: None (Pt having issues w/ her insurance) Last Refill: 01/28/2020 #90 and 1RF  Please Advise

## 2020-11-17 ENCOUNTER — Other Ambulatory Visit: Payer: Self-pay | Admitting: Internal Medicine

## 2020-11-17 MED ORDER — ESZOPICLONE 3 MG PO TABS: 3.0000 mg | ORAL_TABLET | Freq: Every day | ORAL | 1 refills | Status: DC

## 2021-01-04 ENCOUNTER — Other Ambulatory Visit: Payer: Self-pay

## 2021-01-04 ENCOUNTER — Encounter: Payer: Self-pay | Admitting: Internal Medicine

## 2021-01-04 ENCOUNTER — Ambulatory Visit (INDEPENDENT_AMBULATORY_CARE_PROVIDER_SITE_OTHER): Payer: Medicare HMO | Admitting: Internal Medicine

## 2021-01-04 VITALS — BP 154/72 | HR 50 | Temp 98.1°F | Resp 12 | Ht 60.0 in | Wt 150.8 lb

## 2021-01-04 DIAGNOSIS — R739 Hyperglycemia, unspecified: Secondary | ICD-10-CM | POA: Diagnosis not present

## 2021-01-04 DIAGNOSIS — E785 Hyperlipidemia, unspecified: Secondary | ICD-10-CM | POA: Diagnosis not present

## 2021-01-04 DIAGNOSIS — I10 Essential (primary) hypertension: Secondary | ICD-10-CM | POA: Diagnosis not present

## 2021-01-04 DIAGNOSIS — Z23 Encounter for immunization: Secondary | ICD-10-CM | POA: Diagnosis not present

## 2021-01-04 NOTE — Progress Notes (Signed)
Subjective:    Patient ID: Amy Owens, female    DOB: 1956/02/16, 65 y.o.   MRN: [id number]  DOS:  01/04/2021 Type of visit - description: f/u  Since the last office visit he is doing okay. Reports good compliance with medication. Ambulatory BPs are okay. Admits to some stress.  BP Readings from Last 3 Encounters:  01/04/21 (!) 154/72  01/28/20 138/78  07/29/19 139/63     Review of Systems See above   Past Medical History:  Diagnosis Date   Anxiety    Chest pain    atypical, neg stress test 2009   DM (diabetes mellitus) (HCC)    borderline    Hyperlipidemia    Hypertension    Insomnia    sleep disorder unspec   Migraine headache    Perimenopausal     Past Surgical History:  Procedure Laterality Date   CESAREAN SECTION     COLONOSCOPY W/ POLYPECTOMY  2006   KNEE ARTHROSCOPY  2007   TUBAL LIGATION      Allergies as of 01/04/2021   No Known Allergies      Medication List        Accurate as of January 04, 2021  3:11 PM. If you have any questions, ask your nurse or doctor.          aspirin 81 MG chewable tablet Commonly known as: Baby Aspirin Chew 1 tablet (81 mg total) by mouth daily.   cloNIDine 0.2 MG tablet Commonly known as: CATAPRES Take 1 tablet (0.2 mg total) by mouth daily.   Eszopiclone 3 MG Tabs Take 1 tablet (3 mg total) by mouth at bedtime.   FLUoxetine 20 MG tablet Commonly known as: PROZAC Take 1.5 tablets (30 mg total) by mouth daily.   meloxicam 15 MG tablet Commonly known as: MOBIC Take 1 tablet (15 mg total) by mouth daily.   metoprolol tartrate 100 MG tablet Commonly known as: LOPRESSOR TAKE 1 TABLET BY MOUTH EVERY MORNING AND ONE-HALF TABLET BY MOUTH EVERY EVENING.   olmesartan 20 MG tablet Commonly known as: BENICAR Take 1 tablet (20 mg total) by mouth daily.   omeprazole 40 MG capsule Commonly known as: PRILOSEC TAKE 1 CAPSULE(40 MG) BY MOUTH DAILY BEFORE BREAKFAST   rosuvastatin 10 MG  tablet Commonly known as: CRESTOR Take 1 tablet (10 mg total) by mouth daily.   triamterene-hydrochlorothiazide 37.5-25 MG capsule Commonly known as: DYAZIDE Take 1 each (1 capsule total) by mouth every morning.           Objective:   Physical Exam BP (!) 154/72 (BP Location: Right Arm, Cuff Size: Large)   Pulse (!) 50   Temp 98.1 F (36.7 C) (Oral)   Resp 12   Ht 5' (1.524 m)   Wt 150 lb 12.8 oz (68.4 kg)   SpO2 99%   BMI 29.45 kg/m  General:   Well developed, NAD, BMI noted. HEENT:  Normocephalic . Face symmetric, atraumatic Lungs:  CTA B Normal respiratory effort, no intercostal retractions, no accessory muscle use. Heart: RRR,  no murmur.  Lower extremities: no pretibial edema bilaterally  Skin: Not pale. Not jaundice Neurologic:  alert & oriented X3.  Speech normal, gait appropriate for age and unassisted Psych--  Cognition and judgment appear intact.  Cooperative with normal attention span and concentration.  Behavior appropriate. No anxious or depressed appearing.      Assessment    Assessment prediabetes  HTN Hyperlipidemia GERD anxiety, insomnia: --Intolerant to [employer] 2014 (HAs), Alfonso Patten  ok  --rx prozac 01-2015, good results H/o  migraines H/o CP (-) stress test 2009 +FH CAD, DM  Covid vax 10-2020 per pt   PLAN Prediabetes: Check A1c @ next opportunity HTN: amb BPs ok, cont  clonidine, metoprolol, benicar, dyazide, check BMP Hyperlipidemia: on crestor, pt is fasting, labs Stress: lost a sister to a MI, counseled, rec to consider see a counselor  Preventive care: Flu shot today Rec Covid booster RTC 4 m, CPX  This visit occurred during the SARS-CoV-2 public health emergency.  Safety protocols were in place, including screening questions prior to the visit, additional usage of staff PPE, and extensive cleaning of exam room while observing appropriate contact time as indicated for disinfecting solutions.

## 2021-01-04 NOTE — Patient Instructions (Addendum)
Proceed with a COVID-vaccine at your convenience  Check your blood pressure at least once a week BP GOAL is between 110/65 and  135/85. If it is consistently higher or lower, let me know   GO TO THE LAB : Get the blood work     GO TO THE FRONT DESK, PLEASE SCHEDULE YOUR APPOINTMENTS Come back for a physical exam in 4 months

## 2021-01-05 LAB — BASIC METABOLIC PANEL
BUN: 32 mg/dL — ABNORMAL HIGH (ref 6–23)
CO2: 28 mEq/L (ref 19–32)
Calcium: 9.8 mg/dL (ref 8.4–10.5)
Chloride: 101 mEq/L (ref 96–112)
Creatinine, Ser: 1.12 mg/dL (ref 0.40–1.20)
GFR: 51.02 mL/min — ABNORMAL LOW (ref >60.00)
Glucose, Bld: 90 mg/dL (ref 70–99)
Potassium: 4.3 mEq/L (ref 3.5–5.1)
Sodium: 139 mEq/L (ref 135–145)

## 2021-01-05 LAB — LIPID PANEL
Cholesterol: 187 mg/dL (ref 0–200)
HDL: 50.7 mg/dL (ref >39.00)
LDL Cholesterol: 101 mg/dL — ABNORMAL HIGH (ref 0–99)
NonHDL: 136.72
Total CHOL/HDL Ratio: 4
Triglycerides: 179 mg/dL — ABNORMAL HIGH (ref 0.0–149.0)
VLDL: 35.8 mg/dL (ref 0.0–40.0)

## 2021-01-05 NOTE — Assessment & Plan Note (Signed)
Prediabetes: Check A1c @ next opportunity HTN: amb BPs ok, cont  clonidine, metoprolol, benicar, dyazide, check BMP Hyperlipidemia: on crestor, pt is fasting, labs Stress: lost a sister to a MI, counseled, rec to consider see a counselor  Preventive care: Flu shot today Rec Covid booster RTC 4 m, CPX

## 2021-01-24 ENCOUNTER — Other Ambulatory Visit: Payer: Self-pay | Admitting: Podiatry

## 2021-01-24 NOTE — Telephone Encounter (Signed)
Please advise 

## 2021-02-20 ENCOUNTER — Other Ambulatory Visit: Payer: Self-pay | Admitting: Internal Medicine

## 2021-03-21 ENCOUNTER — Other Ambulatory Visit: Payer: Self-pay | Admitting: Internal Medicine

## 2021-04-08 ENCOUNTER — Telehealth: Payer: Self-pay | Admitting: Internal Medicine

## 2021-04-08 NOTE — Telephone Encounter (Signed)
Left message for patient to call back and schedule Medicare Annual Wellness Visit (AWV) either virtually or in office.   Last AWV ; please schedule at anytime with health coach

## 2021-04-14 ENCOUNTER — Other Ambulatory Visit: Payer: Self-pay | Admitting: Internal Medicine

## 2021-04-14 DIAGNOSIS — Z1231 Encounter for screening mammogram for malignant neoplasm of breast: Secondary | ICD-10-CM

## 2021-04-26 ENCOUNTER — Ambulatory Visit
Admission: RE | Admit: 2021-04-26 | Discharge: 2021-04-26 | Disposition: A | Discharge status: Home / Self Care | Payer: BC Managed Care – PPO | Source: Ambulatory Visit | Attending: Internal Medicine | Admitting: Internal Medicine

## 2021-04-26 DIAGNOSIS — Z1231 Encounter for screening mammogram for malignant neoplasm of breast: Secondary | ICD-10-CM

## 2021-05-06 ENCOUNTER — Encounter: Payer: Self-pay | Admitting: Internal Medicine

## 2021-05-06 ENCOUNTER — Ambulatory Visit (INDEPENDENT_AMBULATORY_CARE_PROVIDER_SITE_OTHER): Payer: BC Managed Care – PPO | Admitting: Internal Medicine

## 2021-05-06 VITALS — BP 126/78 | HR 50 | Temp 97.9°F | Resp 16 | Ht 60.0 in | Wt 146.0 lb

## 2021-05-06 DIAGNOSIS — R1013 Epigastric pain: Secondary | ICD-10-CM

## 2021-05-06 DIAGNOSIS — Z0000 Encounter for general adult medical examination without abnormal findings: Secondary | ICD-10-CM

## 2021-05-06 DIAGNOSIS — I10 Essential (primary) hypertension: Secondary | ICD-10-CM

## 2021-05-06 DIAGNOSIS — E785 Hyperlipidemia, unspecified: Secondary | ICD-10-CM

## 2021-05-06 DIAGNOSIS — R739 Hyperglycemia, unspecified: Secondary | ICD-10-CM

## 2021-05-06 DIAGNOSIS — Z0001 Encounter for general adult medical examination with abnormal findings: Secondary | ICD-10-CM

## 2021-05-06 MED ORDER — ROSUVASTATIN CALCIUM 20 MG PO TABS: 20.0000 mg | ORAL_TABLET | Freq: Every day | ORAL | 1 refills | Status: DC

## 2021-05-06 NOTE — Patient Instructions (Addendum)
Check the  blood pressure regularly ?BP GOAL is between 110/65 and  135/85. ?If it is consistently higher or lower, let me know ? ?Stop omeprazole 2 weeks. ?Make an appointment and come back in 2 weeks to provide a H. pylori breathing test. ? ?After you provide a sample, you can go back on omeprazole. ? ?Increase Crestor to 20 mg every night ? ? GO TO THE LAB : Get the blood work   ? ? ?GO TO THE FRONT DESK, PLEASE SCHEDULE YOUR APPOINTMENTS ?Come back for   a checkup in 4 months ? ? ?"Living will", "Health Care Power of attorney": Advanced care planning ? ?(If you already have a living will or healthcare power of attorney, please bring the copy to be scanned in your chart.) ? ?Advance care planning is a process that supports adults in  understanding and sharing their preferences regarding future medical care.  ? ?The patient's preferences are recorded in documents called Advance Directives.    ?Advanced directives are completed (and can be modified at any time) while the patient is in full mental capacity.  ? ?The documentation should be available at all times to the patient, the family and the healthcare providers.  ?Bring in a copy to be scanned in your chart is an excellent idea and is recommended  ? ?This legal documents direct treatment decision making and/or appoint a surrogate to make the decision if the patient is not capable to do so.  ? ? ?Advance directives can be documented in many types of formats,  documents have names such as:  ?Lliving will  ?Durable power of attorney for healthcare (healthcare proxy or healthcare power of attorney)  ?Combined directives  ?Physician orders for life-sustaining treatment  ?  ?More information at: ? ?[email or web address]  ?

## 2021-05-06 NOTE — Progress Notes (Signed)
? ?Subjective:  ? ? Patient ID: Amy Owens, female    DOB: [birth year], 66 y.o.   MRN: [id number] ? ?DOS:  05/06/2021 ?Type of visit - description: cpx ? ?Here for routine physical exam. ?In general feels well. ?Did report a burning at the epigastrium for the last 4 months. ?She takes PPIs daily. ?The pain could be day or night on and off. ?Occasional nausea, no vomiting.  No change in the color of the stools, no weight loss. ?No dysphagia or odynophagia. ? ? ?Review of Systems ? ?No chest pain or difficulty breathing ?No palpitations. ? ?Other than above, a 14 point review of systems is negative  ?  ? ?Past Medical History:  ?Diagnosis Date  ? Anxiety   ? Chest pain   ? atypical, neg stress test 2009  ? DM (diabetes mellitus) ([place])   ? borderline   ? Hyperlipidemia   ? Hypertension   ? Insomnia   ? sleep disorder unspec  ? Migraine headache   ? Perimenopausal   ? ? ?Past Surgical History:  ?Procedure Laterality Date  ? CESAREAN SECTION    ? COLONOSCOPY W/ POLYPECTOMY  2006  ? KNEE ARTHROSCOPY  2007  ? TUBAL LIGATION    ? ?Social History  ? ?Socioeconomic History  ? Marital status: Married  ?  Spouse name: Not on file  ? Number of children: 3  ? Years of education: Not on file  ? Highest education level: Not on file  ?Occupational History  ? Occupation: stay home   ?Tobacco Use  ? Smoking status: Never  ? Smokeless tobacco: Never  ?Substance and Sexual Activity  ? Alcohol use: No  ? Drug use: No  ? Sexual activity: Not on file  ?Other Topics Concern  ? Not on file  ?Social History Narrative  ? Original from [place].   ? 3 children, finished college, all working    ?    ? ?Social Determinants of Health  ? ?Financial Resource Strain: Not on file  ?Food Insecurity: Not on file  ?Transportation Needs: Not on file  ?Physical Activity: Not on file  ?Stress: Not on file  ?Social Connections: Not on file  ?Intimate Partner Violence: Not on file  ? ? ? ?Current Outpatient Medications  ?Medication Instructions   ? aspirin (BABY ASPIRIN) 81 mg, Oral, Daily  ? cloNIDine (CATAPRES) 0.2 MG tablet TAKE ONE TABLET BY MOUTH DAILY  ? Eszopiclone 3 mg, Oral, Daily at bedtime  ? FLUoxetine (PROZAC) 20 MG tablet TAKE 1 AND 1/2 TABLET BY MOUTH DAILY  ? meloxicam (MOBIC) 15 MG tablet TAKE 1 TABLET(15 MG) BY MOUTH DAILY  ? metoprolol tartrate (LOPRESSOR) 100 MG tablet TAKE ONE TABLET BY MOUTH EVERY MORNING AND TAKE 1 AND 1/2 TABLET BY MOUTH EVERY EVENING  ? olmesartan (BENICAR) 20 mg, Oral, Daily  ? omeprazole (PRILOSEC) 40 MG capsule TAKE ONE CAPSULE BY MOUTH DAILY BEFORE BREAKFAST  ? rosuvastatin (CRESTOR) 20 mg, Oral, Daily at bedtime  ? triamterene-hydrochlorothiazide (DYAZIDE) 37.5-25 MG capsule TAKE ONE CAPSULE BY MOUTH EVERY MORNING  ? ? ?   ?Objective:  ? Physical Exam ?BP 126/78 (BP Location: Left Arm, Patient Position: Sitting, Cuff Size: Small)   Pulse (!) 50   Temp 97.9 ?F (36.6 ?C) (Oral)   Resp 16   Ht 5' (1.524 m)   Wt 146 lb (66.2 kg)   SpO2 98%   BMI 28.51 kg/m?  ?General: ?Well developed, NAD, BMI noted ?Neck: No  thyromegaly  ?HEENT:  ?  Normocephalic . Face symmetric, atraumatic ?Lungs:  ?CTA B ?Normal respiratory effort, no intercostal retractions, no accessory muscle use. ?Heart: RRR,  no murmur.  ?Abdomen:  ?Not distended, soft, non-tender. No rebound or rigidity.   ?Lower extremities: no pretibial edema bilaterally  ?Skin: Exposed areas without rash. Not pale. Not jaundice ?Neurologic:  ?alert & oriented X3.  ?Speech normal, gait appropriate for age and unassisted ?Strength symmetric and appropriate for age.  ?Psych: ?Cognition and judgment appear intact.  ?Cooperative with normal attention span and concentration.  ?Behavior appropriate. ?No anxious or depressed appearing. ? ?   ?Assessment   ? ?Assessment ?Prediabetes  ?HTN ?Hyperlipidemia ?GERD ?anxiety, insomnia: ?--Intolerant to [place] 2014 (HAs), lunesta ok  ?--rx prozac 01-2015, good results ?H/o  migraines ?H/o CP (-) stress test 2009 ?+FH CAD, DM   ?Covid vax [phone] per pt  ? ?PLAN ?Here for CPX ?Prediabetes: Checking labs ?HTN: Ambulatory BPs are very good, only 1 time her BP was 89/60.  For now recommend to continue the same medications, if she has more episodes of low blood pressure let me know.  Checking labs ?Hyperlipidemia: ?On Crestor 10 mg, last FLP satisfactory.  See next ?Cardiovascular risk: Currently 15.2%, + FH CAD (father, brother  and sister). ?Recommend to continue aspirin, we agreed to  increase Crestor 10 >> 20 mg, reckeck labs on RTC ?GERD, epigastric burning. ?Symptoms as described above, no worrisome findings on the ROS. ?Plan: Hold PPIs, check H. pylori breathing test. ?After H. pylori, restart PPIs. ?If not better consider GI referrals.    ?Avoid NSAIDs ?RTC 4 months ?  ?In addition to CPX, I addressed her chronic medical problems including a new one (epigastric burning) ? ? This visit occurred during the SARS-CoV-2 public health emergency.  Safety protocols were in place, including screening questions prior to the visit, additional usage of staff PPE, and extensive cleaning of exam room while observing appropriate contact time as indicated for disinfecting solutions.  ? ?

## 2021-05-07 ENCOUNTER — Encounter: Payer: Self-pay | Admitting: Internal Medicine

## 2021-05-07 LAB — CBC WITH DIFFERENTIAL/PLATELET
Absolute Monocytes: 548 cells/uL (ref 200–950)
Basophils Absolute: 38 cells/uL (ref 0–200)
Basophils Relative: 0.5 %
Eosinophils Absolute: 278 cells/uL (ref 15–500)
Eosinophils Relative: 3.7 %
HCT: 38.7 % (ref 35.0–45.0)
Hemoglobin: 13.1 g/dL (ref 11.7–15.5)
Lymphs Abs: 2588 cells/uL (ref 850–3900)
MCH: 31 pg (ref 27.0–33.0)
MCHC: 33.9 g/dL (ref 32.0–36.0)
MCV: 91.7 fL (ref 80.0–100.0)
MPV: 10.6 fL (ref 7.5–12.5)
Monocytes Relative: 7.3 %
Neutro Abs: 4050 cells/uL (ref 1500–7800)
Neutrophils Relative %: 54 %
Platelets: 207 10*3/uL (ref 140–400)
RBC: 4.22 10*6/uL (ref 3.80–5.10)
RDW: 12 % (ref 11.0–15.0)
Total Lymphocyte: 34.5 %
WBC: 7.5 10*3/uL (ref 3.8–10.8)

## 2021-05-07 LAB — COMPREHENSIVE METABOLIC PANEL
AG Ratio: 1.7 (calc) (ref 1.0–2.5)
ALT: 17 U/L (ref 6–29)
AST: 22 U/L (ref 10–35)
Albumin: 4.7 g/dL (ref 3.6–5.1)
Alkaline phosphatase (APISO): 52 U/L (ref 37–153)
BUN/Creatinine Ratio: 27 (calc) — ABNORMAL HIGH (ref 6–22)
BUN: 33 mg/dL — ABNORMAL HIGH (ref 7–25)
CO2: 28 mmol/L (ref 20–32)
Calcium: 9.9 mg/dL (ref 8.6–10.4)
Chloride: 101 mmol/L (ref 98–110)
Creat: 1.24 mg/dL — ABNORMAL HIGH (ref 0.50–1.05)
Globulin: 2.7 g/dL (calc) (ref 1.9–3.7)
Glucose, Bld: 95 mg/dL (ref 65–99)
Potassium: 4.5 mmol/L (ref 3.5–5.3)
Sodium: 140 mmol/L (ref 135–146)
Total Bilirubin: 0.6 mg/dL (ref 0.2–1.2)
Total Protein: 7.4 g/dL (ref 6.1–8.1)

## 2021-05-07 LAB — HEMOGLOBIN A1C
Hgb A1c MFr Bld: 5.9 % of total Hgb — ABNORMAL HIGH (ref <5.7)
Mean Plasma Glucose: 123 mg/dL
eAG (mmol/L): 6.8 mmol/L

## 2021-05-07 NOTE — Assessment & Plan Note (Signed)
-   tdap- 2014 ?- zostavax 2014;   ?- s/p Shingrex  ?- PNM 13: 2020 ; PNM 23 : 2021 ?- s/p covid vax  booster is an option ?- had a flu shot   ?-- female care: PAP 12/2016; MMG 03/2021 ([facility])  ?--Cscope 2006: tubular adenomas, Cscope ahgain 06-2011, 1 polyp, not recovered; cscope 11-2016: wnl, next 5 years, see report   ?-- diet-exercise: Discussed. ?--bones: T Score 11-2017: -1.1. ?--labs:   CMP CBC A1c H. pylori breathing test ? ?  ?

## 2021-05-07 NOTE — Assessment & Plan Note (Signed)
Here for CPX ?Prediabetes: Checking labs ?HTN: Ambulatory BPs are very good, only 1 time her BP was 89/60.  For now recommend to continue the same medications, if she has more episodes of low blood pressure let me know.  Checking labs ?Hyperlipidemia: ?On Crestor 10 mg, last FLP satisfactory.  See next ?Cardiovascular risk: Currently 15.2%, + FH CAD (father, brother  and sister). ?Recommend to continue aspirin, we agreed to  increase Crestor 10 >> 20 mg, reckeck labs on RTC ?GERD, epigastric burning. ?Symptoms as described above, no worrisome findings on the ROS. ?Plan: Hold PPIs, check H. pylori breathing test. ?After H. pylori, restart PPIs. ?If not better consider GI referrals.    ?Avoid NSAIDs ?RTC 4 months ?  ?

## 2021-05-23 ENCOUNTER — Other Ambulatory Visit: Payer: BC Managed Care – PPO

## 2021-05-23 DIAGNOSIS — R1013 Epigastric pain: Secondary | ICD-10-CM

## 2021-05-24 ENCOUNTER — Other Ambulatory Visit: Payer: Self-pay | Admitting: Internal Medicine

## 2021-05-24 LAB — H. PYLORI BREATH TEST: H. pylori Breath Test: DETECTED — AB

## 2021-05-26 MED ORDER — OMEPRAZOLE 40 MG PO CPDR: DELAYED_RELEASE_CAPSULE | ORAL | 0 refills | Status: DC

## 2021-05-26 MED ORDER — PYLERA 140-125-125 MG PO CAPS: 3.0000 | ORAL_CAPSULE | Freq: Three times a day (TID) | ORAL | 0 refills | Status: DC

## 2021-05-26 NOTE — Addendum Note (Signed)
Addended byConrad Southside D on: 05/26/2021 01:23 PM ? ? Modules accepted: Orders ? ?

## 2021-06-20 ENCOUNTER — Other Ambulatory Visit: Payer: Self-pay | Admitting: Internal Medicine

## 2021-06-20 ENCOUNTER — Telehealth: Payer: Self-pay

## 2021-06-20 NOTE — Telephone Encounter (Signed)
PA initiated via Covermymeds; KEY: [id number]. Awaiting determination.  ?

## 2021-06-21 NOTE — Telephone Encounter (Signed)
PA cancelled by plan. Fax received from [facility]- PA is not required.  ?

## 2021-06-30 ENCOUNTER — Ambulatory Visit (INDEPENDENT_AMBULATORY_CARE_PROVIDER_SITE_OTHER): Payer: BC Managed Care – PPO

## 2021-06-30 ENCOUNTER — Encounter: Payer: Self-pay | Admitting: Podiatry

## 2021-06-30 ENCOUNTER — Ambulatory Visit: Payer: BC Managed Care – PPO | Admitting: Podiatry

## 2021-06-30 DIAGNOSIS — R609 Edema, unspecified: Secondary | ICD-10-CM | POA: Diagnosis not present

## 2021-06-30 DIAGNOSIS — M109 Gout, unspecified: Secondary | ICD-10-CM | POA: Diagnosis not present

## 2021-06-30 DIAGNOSIS — M7672 Peroneal tendinitis, left leg: Secondary | ICD-10-CM

## 2021-06-30 MED ORDER — TRIAMCINOLONE ACETONIDE 10 MG/ML IJ SUSP
10.0000 mg | Freq: Once | INTRAMUSCULAR | Status: AC
  Administered 2021-06-30: 10 mg

## 2021-06-30 NOTE — Progress Notes (Signed)
Subjective:  ? ?Patient ID: Amy Owens, female   DOB: 66 y.o.   MRN: [id number]  ? ?HPI ?Patient presents with caregiver with exquisite discomfort in the left lateral foot and stated it started 10 days ago and has been very hard to walk on.  She had problems around the big toe joint in the past and that was about a year and a half ago and this has been quite intense  ? ? ?ROS ? ? ?   ?Objective:  ?Physical Exam  ?Neurovascular status intact muscle strength adequate with exquisite discomfort lateral side left foot around the peroneal tertius tendon with edema of the entire forefoot and midfoot with negative Denna Haggard' sign noted ? ?   ?Assessment:  ?Very good chance that this may be a systemic inflammatory condition or possible gout versus a local condition ? ?   ?Plan:  ?H&P reviewed and since its been a year and a half since if any other attacks organ to defer blood work but we may do if symptoms were to come back again quickly or another area of inflammation were to develop.  At this point I went ahead did sterile prep injected the peroneal tertius group 3 mg Kenalog 5 mg Xylocaine I applied Unna boot Ace wrap and surgical shoe advised on elevation and if symptoms are not better in the next week I want to see him back and we will order blood work ? ?X-rays indicate no signs of fracture or aggressive arthritis associated with the swelling condition the patient has ?   ? ? ?

## 2021-07-01 ENCOUNTER — Other Ambulatory Visit: Payer: Self-pay | Admitting: Podiatry

## 2021-07-01 DIAGNOSIS — M109 Gout, unspecified: Secondary | ICD-10-CM

## 2021-07-08 ENCOUNTER — Ambulatory Visit (INDEPENDENT_AMBULATORY_CARE_PROVIDER_SITE_OTHER): Payer: BC Managed Care – PPO | Admitting: Podiatry

## 2021-07-08 ENCOUNTER — Encounter: Payer: Self-pay | Admitting: Podiatry

## 2021-07-08 DIAGNOSIS — M109 Gout, unspecified: Secondary | ICD-10-CM | POA: Diagnosis not present

## 2021-07-08 DIAGNOSIS — M7752 Other enthesopathy of left foot: Secondary | ICD-10-CM

## 2021-07-08 DIAGNOSIS — R609 Edema, unspecified: Secondary | ICD-10-CM

## 2021-07-08 MED ORDER — TRIAMCINOLONE ACETONIDE 10 MG/ML IJ SUSP
10.0000 mg | Freq: Once | INTRAMUSCULAR | Status: AC
  Administered 2021-07-08: 10 mg

## 2021-07-08 NOTE — Progress Notes (Signed)
Subjective:  ? ?Patient ID: Amy Owens, female   DOB: 66 y.o.   MRN: [id number]  ? ?HPI ?Patient states that she has improvement in the outside of her left foot but her second joint left is inflamed with fluid buildup and it is hard for her to walk on it.  States she gets this periodically and they are concerned about something systemic ? ? ?ROS ? ? ?   ?Objective:  ?Physical Exam  ?Neurovascular status intact with inflammation fluid of the second MPJ left that is painful when pressed and appears to have a swelling mechanism associated with it ? ?   ?Assessment:  ?I am concerned about the possibility for gout-like symptomatology and also I do think there is an inflammation of the capsule of the second MPJ ? ?   ?Plan:  ?H&P reviewed with her and husband and I am sending for blood work to do arthritic profile to rule out any other pathology that way.  Today I went ahead I did sterile prep and I did local anesthetic block of the left forefoot I aspirated the second MPJ getting out of relatively clear fluid injected quarter cc dexamethasone Kenalog and I will review the results of blood work and discuss with them and see the results of this injection I did today.  Reappoint earlier if anything were to occur signed visit ?   ? ? ?

## 2021-07-11 LAB — RHEUMATOID ARTHRITIS PROFILE
Cyclic Citrullin Peptide Ab: <1 units (ref 0–19)
Rheumatoid fact SerPl-aCnc: <10 IU/mL (ref <14.0)

## 2021-07-14 ENCOUNTER — Encounter: Payer: Self-pay | Admitting: Podiatry

## 2021-07-21 ENCOUNTER — Encounter: Payer: Self-pay | Admitting: Podiatry

## 2021-07-21 ENCOUNTER — Ambulatory Visit (INDEPENDENT_AMBULATORY_CARE_PROVIDER_SITE_OTHER): Payer: BC Managed Care – PPO | Admitting: Podiatry

## 2021-07-21 DIAGNOSIS — M109 Gout, unspecified: Secondary | ICD-10-CM

## 2021-07-21 DIAGNOSIS — M7752 Other enthesopathy of left foot: Secondary | ICD-10-CM | POA: Diagnosis not present

## 2021-07-21 MED ORDER — TRIAMCINOLONE ACETONIDE 10 MG/ML IJ SUSP
10.0000 mg | Freq: Once | INTRAMUSCULAR | Status: AC
  Administered 2021-07-21: 10 mg

## 2021-07-21 NOTE — Progress Notes (Signed)
Subjective:   Patient ID: Amy Owens, female   DOB: 66 y.o.   MRN: [id number]   HPI Patient presents stating the joint seems to be doing well but now she is getting pain around her big toe joint left and did have a high uric acid a while ago   ROS      Objective:  Physical Exam  Neurovascular status intact with inflammation that is now around the first MPJ left with the second MPJ left doing well with reduced discomfort fluid buildup in the joint surface     Assessment:  Doing well post treatment for inflammatory capsulitis second with inflammation of the first metatarsal phalangeal joint and mostly medial     Plan:  Waiting for results of blood work and I did have assistant call to try to find them and I did do sterile prep and injected around the first MPJ 3 mg Dexasone Kenalog 5 mg Xylocaine and I want patient to be seen back as symptoms indicate.  May need to be placed on medicine if I find uric acid level to be high

## 2021-07-26 ENCOUNTER — Other Ambulatory Visit: Payer: Self-pay

## 2021-07-28 ENCOUNTER — Other Ambulatory Visit: Payer: Self-pay | Admitting: Podiatry

## 2021-07-28 DIAGNOSIS — M109 Gout, unspecified: Secondary | ICD-10-CM

## 2021-07-30 LAB — CBC WITH DIFFERENTIAL/PLATELET
Absolute Monocytes: 524 cells/uL (ref 200–950)
Basophils Absolute: 48 cells/uL (ref 0–200)
Basophils Relative: 0.7 %
Eosinophils Absolute: 184 cells/uL (ref 15–500)
Eosinophils Relative: 2.7 %
HCT: 39.2 % (ref 35.0–45.0)
Hemoglobin: 13 g/dL (ref 11.7–15.5)
Lymphs Abs: 2156 cells/uL (ref 850–3900)
MCH: 31.2 pg (ref 27.0–33.0)
MCHC: 33.2 g/dL (ref 32.0–36.0)
MCV: 94 fL (ref 80.0–100.0)
MPV: 9.4 fL (ref 7.5–12.5)
Monocytes Relative: 7.7 %
Neutro Abs: 3890 cells/uL (ref 1500–7800)
Neutrophils Relative %: 57.2 %
Platelets: 218 10*3/uL (ref 140–400)
RBC: 4.17 10*6/uL (ref 3.80–5.10)
RDW: 12.4 % (ref 11.0–15.0)
Total Lymphocyte: 31.7 %
WBC: 6.8 10*3/uL (ref 3.8–10.8)

## 2021-07-30 LAB — COMPREHENSIVE METABOLIC PANEL
AG Ratio: 1.8 (calc) (ref 1.0–2.5)
ALT: 18 U/L (ref 6–29)
AST: 21 U/L (ref 10–35)
Albumin: 4.8 g/dL (ref 3.6–5.1)
Alkaline phosphatase (APISO): 62 U/L (ref 37–153)
BUN/Creatinine Ratio: 32 (calc) — ABNORMAL HIGH (ref 6–22)
BUN: 39 mg/dL — ABNORMAL HIGH (ref 7–25)
CO2: 29 mmol/L (ref 20–32)
Calcium: 10.1 mg/dL (ref 8.6–10.4)
Chloride: 102 mmol/L (ref 98–110)
Creat: 1.21 mg/dL — ABNORMAL HIGH (ref 0.50–1.05)
Globulin: 2.6 g/dL (calc) (ref 1.9–3.7)
Glucose, Bld: 107 mg/dL — ABNORMAL HIGH (ref 65–99)
Potassium: 4.5 mmol/L (ref 3.5–5.3)
Sodium: 140 mmol/L (ref 135–146)
Total Bilirubin: 0.5 mg/dL (ref 0.2–1.2)
Total Protein: 7.4 g/dL (ref 6.1–8.1)

## 2021-07-30 LAB — C-REACTIVE PROTEIN: CRP: 1.4 mg/L (ref <8.0)

## 2021-07-30 LAB — URIC ACID: Uric Acid, Serum: 7.9 mg/dL — ABNORMAL HIGH (ref 2.5–7.0)

## 2021-07-30 LAB — SEDIMENTATION RATE: Sed Rate: 6 mm/h (ref 0–30)

## 2021-07-31 ENCOUNTER — Telehealth: Payer: Self-pay | Admitting: Internal Medicine

## 2021-08-01 NOTE — Telephone Encounter (Signed)
PDMP okay, Rx sent 

## 2021-08-01 NOTE — Telephone Encounter (Signed)
Requesting: eszopiclone 3mg   Contract: 01/28/20 UDS: None Last Visit: 05/06/21 Next Visit: 09/05/21 Last Refill: 11/17/20 #90 and 1RF  Please Advise

## 2021-08-08 ENCOUNTER — Other Ambulatory Visit: Payer: Self-pay | Admitting: Podiatry

## 2021-08-08 MED ORDER — ALLOPURINOL 100 MG PO TABS: 100.0000 mg | ORAL_TABLET | Freq: Every day | ORAL | 6 refills | Status: DC

## 2021-08-08 NOTE — Progress Notes (Unsigned)
.  all

## 2021-08-21 ENCOUNTER — Other Ambulatory Visit: Payer: Self-pay | Admitting: Internal Medicine

## 2021-09-05 ENCOUNTER — Ambulatory Visit: Payer: BC Managed Care – PPO | Admitting: Internal Medicine

## 2021-09-05 ENCOUNTER — Encounter: Payer: Self-pay | Admitting: Internal Medicine

## 2021-09-05 VITALS — BP 124/74 | HR 53 | Temp 97.9°F | Resp 16 | Ht 60.0 in | Wt 148.1 lb

## 2021-09-05 DIAGNOSIS — E785 Hyperlipidemia, unspecified: Secondary | ICD-10-CM

## 2021-09-05 DIAGNOSIS — M109 Gout, unspecified: Secondary | ICD-10-CM

## 2021-09-05 DIAGNOSIS — A048 Other specified bacterial intestinal infections: Secondary | ICD-10-CM | POA: Diagnosis not present

## 2021-09-05 DIAGNOSIS — R1013 Epigastric pain: Secondary | ICD-10-CM | POA: Diagnosis not present

## 2021-09-05 LAB — LIPID PANEL
Cholesterol: 151 mg/dL (ref 0–200)
HDL: 47.6 mg/dL (ref >39.00)
LDL Cholesterol: 78 mg/dL (ref 0–99)
NonHDL: 103.08
Total CHOL/HDL Ratio: 3
Triglycerides: 123 mg/dL (ref 0.0–149.0)
VLDL: 24.6 mg/dL (ref 0.0–40.0)

## 2021-09-05 LAB — COMPREHENSIVE METABOLIC PANEL
ALT: 23 U/L (ref 0–35)
AST: 20 U/L (ref 0–37)
Albumin: 4.6 g/dL (ref 3.5–5.2)
Alkaline Phosphatase: 57 U/L (ref 39–117)
BUN: 37 mg/dL — ABNORMAL HIGH (ref 6–23)
CO2: 30 mEq/L (ref 19–32)
Calcium: 9.9 mg/dL (ref 8.4–10.5)
Chloride: 101 mEq/L (ref 96–112)
Creatinine, Ser: 1.19 mg/dL (ref 0.40–1.20)
GFR: 47.22 mL/min — ABNORMAL LOW (ref >60.00)
Glucose, Bld: 98 mg/dL (ref 70–99)
Potassium: 4.4 mEq/L (ref 3.5–5.1)
Sodium: 138 mEq/L (ref 135–145)
Total Bilirubin: 0.5 mg/dL (ref 0.2–1.2)
Total Protein: 7 g/dL (ref 6.0–8.3)

## 2021-09-05 LAB — URIC ACID: Uric Acid, Serum: 5.9 mg/dL (ref 2.4–7.0)

## 2021-09-05 MED ORDER — COLCHICINE 0.6 MG PO TABS: 0.6000 mg | ORAL_TABLET | Freq: Two times a day (BID) | ORAL | 1 refills | Status: DC | PRN

## 2021-09-05 MED ORDER — PREDNISONE 10 MG PO TABS: ORAL_TABLET | ORAL | 0 refills | Status: DC

## 2021-09-05 NOTE — Progress Notes (Unsigned)
Subjective:    Patient ID: Amy Owens, female    DOB: 30-Aug-1955, 66 y.o.   MRN: [id number]  DOS:  09/05/2021 Type of visit - description: f/u  Follow-up, several issues discussed. H. pylori infection: Took Pylera as prescribed, epigastric pain decreased but not gone, worse with food. Still has some nausea. Denies vomiting, no blood in the stools. Not taking any NSAIDs.  May 2023 developed pain at the L toe associated with swelling, warmness.  Pain described as exquisite. Uric acid was elevated, podiatry prescribed allopurinol.  Overall pain has decreased to some extent.  Crestor dose increased since LOV, good compliance.  Review of Systems See above   Past Medical History:  Diagnosis Date   Anxiety    Chest pain    atypical, neg stress test 2009   DM (diabetes mellitus) ([facility])    borderline    Hyperlipidemia    Hypertension    Insomnia    sleep disorder unspec   Migraine headache    Perimenopausal     Past Surgical History:  Procedure Laterality Date   CESAREAN SECTION     COLONOSCOPY W/ POLYPECTOMY  2006   KNEE ARTHROSCOPY  2007   TUBAL LIGATION      Current Outpatient Medications  Medication Instructions   allopurinol (ZYLOPRIM) 100 mg, Oral, Daily   aspirin (BABY ASPIRIN) 81 mg, Oral, Daily   bismuth-metronidazole-tetracycline (PYLERA) 140-125-125 MG capsule 3 capsules, Oral, 3 times daily before meals & bedtime   cloNIDine (CATAPRES) 0.2 MG tablet TAKE ONE TABLET BY MOUTH DAILY   Eszopiclone 3 MG TABS TAKE ONE TABLET BY MOUTH AT BEDTIME   FLUoxetine (PROZAC) 20 MG tablet TAKE 1 AND 1/2 TABLET BY MOUTH DAILY   meloxicam (MOBIC) 15 MG tablet TAKE 1 TABLET(15 MG) BY MOUTH DAILY   metoprolol tartrate (LOPRESSOR) 100 MG tablet TAKE ONE TABLET BY MOUTH EVERY MORNING AND TAKE 1 AND 1/2 TABLET BY MOUTH EVERY EVENING   olmesartan (BENICAR) 20 MG tablet TAKE ONE TABLET BY MOUTH DAILY   omeprazole (PRILOSEC) 40 MG capsule TAKE ONE CAPSULE BY MOUTH DAILY  BEFORE BREAKFAST   rosuvastatin (CRESTOR) 20 mg, Oral, Daily at bedtime   triamterene-hydrochlorothiazide (DYAZIDE) 37.5-25 MG capsule TAKE ONE CAPSULE BY MOUTH EVERY MORNING       Objective:   Physical Exam BP 124/74   Pulse (!) 53   Temp 97.9 F (36.6 C) (Oral)   Resp 16   Ht 5' (1.524 m)   Wt 148 lb 2 oz (67.2 kg)   SpO2 97%   BMI 28.93 kg/m  General:   Well developed, NAD, BMI noted.  HEENT:  Normocephalic . Face symmetric, atraumatic Lungs:  CTA B Normal respiratory effort, no intercostal retractions, no accessory muscle use. Heart: RRR,  no murmur.  Abdomen:  Not distended, soft, non-tender. No rebound or rigidity.   Skin: Not pale. Not jaundice Lower extremities: L great toe, at the base there is some swelling and tenderness to palpation without redness or warmness Neurologic:  alert & oriented X3.  Speech normal, gait appropriate for age and unassisted Psych--  Cognition and judgment appear intact.  Cooperative with normal attention span and concentration.  Behavior appropriate. No anxious or depressed appearing.     Assessment   Assessment Prediabetes  HTN Hyperlipidemia GERD Gout (podagra 06-2021, see OV note 09-05-2021) anxiety, insomnia: --Intolerant to [place] 2014 (HAs), lunesta ok  --rx prozac 01-2015, good results H/o  migraines H/o CP (-) stress test 2009 +FH CAD, DM  PLAN H. pylori infection: See last visit, she was having epigastric burning, after holding PPIs H. pylori test came back +, took Pylera, pain is better but not completely gone, still has epigastric burning.  Plan: Continue PPIs daily, refer to GI, EGD questionable Gout: May 2023 >> developed swelling at the base of the L great toe consistent with podagra.  Saw podiatry, uric acid level elevated, started allopurinol 100 mg, good tolerance, pain slightly better, still have some residual inflammation. Plan: Check uric acid, refill allopurinol, round of prednisone, colchicine as  needed, see AVS High cholesterol: At the last visit, Crestor dose increased, check FLP. HTN: Well-controlled on clonidine, metoprolol, olmesartan, Dyazide. RTC 6 months

## 2021-09-05 NOTE — Patient Instructions (Addendum)
Recommend to proceed with covid booster (bivalent) at your pharmacy.   For gout: Continue allopurinol I am prescribing you prednisone for few days to help with inflammation Also, you can take colchicine twice daily as needed for any other gout attacks.  We are referring you to the gastroenterologist Check the  blood pressure regularly BP GOAL is between 110/65 and  135/85. If it is consistently higher or lower, let me know  We are referring you to gastroenterology, Dr. Loreta Ave regards to epigastric burning  GO TO THE LAB : Get the blood work     GO TO THE FRONT DESK, PLEASE SCHEDULE YOUR APPOINTMENTS Come back for a checkup in 6 months

## 2021-09-06 NOTE — Assessment & Plan Note (Signed)
H. pylori infection: See last visit, she was having epigastric burning, after holding PPIs H. pylori test came back +, took Pylera, pain is better but not completely gone, still has epigastric burning.  Plan: Continue PPIs daily, refer to GI, EGD questionable Gout: May 2023 >> developed swelling at the base of the L great toe consistent with podagra.  Saw podiatry, uric acid level elevated, started allopurinol 100 mg, good tolerance, pain slightly better, still have some residual inflammation. Plan: Check uric acid, refill allopurinol, round of prednisone, colchicine as needed, see AVS High cholesterol: At the last visit, Crestor dose increased, check FLP. HTN: Well-controlled on clonidine, metoprolol, olmesartan, Dyazide. RTC 6 months

## 2021-09-25 ENCOUNTER — Other Ambulatory Visit: Payer: Self-pay | Admitting: Internal Medicine

## 2021-10-06 ENCOUNTER — Other Ambulatory Visit: Payer: Self-pay | Admitting: Gastroenterology

## 2021-10-06 ENCOUNTER — Other Ambulatory Visit (HOSPITAL_COMMUNITY): Payer: Self-pay | Admitting: Gastroenterology

## 2021-10-06 DIAGNOSIS — R1011 Right upper quadrant pain: Secondary | ICD-10-CM

## 2021-10-20 ENCOUNTER — Encounter (HOSPITAL_COMMUNITY)
Admission: RE | Admit: 2021-10-20 | Discharge: 2021-10-20 | Disposition: A | Discharge status: Home / Self Care | Payer: BC Managed Care – PPO | Source: Ambulatory Visit | Attending: Gastroenterology | Admitting: Gastroenterology

## 2021-10-20 ENCOUNTER — Ambulatory Visit (HOSPITAL_COMMUNITY)
Admission: RE | Admit: 2021-10-20 | Discharge: 2021-10-20 | Disposition: A | Discharge status: Home / Self Care | Payer: BC Managed Care – PPO | Source: Ambulatory Visit | Attending: Gastroenterology | Admitting: Gastroenterology

## 2021-10-20 DIAGNOSIS — R1011 Right upper quadrant pain: Secondary | ICD-10-CM | POA: Insufficient documentation

## 2021-10-20 MED ORDER — TECHNETIUM TC 99M MEBROFENIN IV KIT
5.3000 | PACK | Freq: Once | INTRAVENOUS | Status: AC | PRN
  Administered 2021-10-20: 5.3 via INTRAVENOUS

## 2021-11-05 ENCOUNTER — Telehealth: Payer: Self-pay | Admitting: Internal Medicine

## 2021-11-07 NOTE — Telephone Encounter (Signed)
PDMP okay, Rx sent 

## 2021-11-07 NOTE — Telephone Encounter (Signed)
Requesting: Lunesta 3mg   Contract: 09/05/21 UDS: None Last Visit: 09/05/21 Next Visit: 03/08/22 Last Refill: 08/01/21 #90 and 0RF  Please Advise

## 2021-11-23 ENCOUNTER — Other Ambulatory Visit: Payer: Self-pay | Admitting: Internal Medicine

## 2022-01-02 ENCOUNTER — Other Ambulatory Visit: Payer: Self-pay | Admitting: Internal Medicine

## 2022-02-04 ENCOUNTER — Other Ambulatory Visit: Payer: Self-pay | Admitting: Podiatry

## 2022-02-27 ENCOUNTER — Other Ambulatory Visit: Payer: Self-pay | Admitting: Internal Medicine

## 2022-03-08 ENCOUNTER — Ambulatory Visit: Payer: BC Managed Care – PPO | Admitting: Internal Medicine

## 2022-03-17 ENCOUNTER — Other Ambulatory Visit: Payer: Self-pay | Admitting: Podiatry

## 2022-03-18 ENCOUNTER — Other Ambulatory Visit: Payer: Self-pay | Admitting: Internal Medicine

## 2022-04-21 ENCOUNTER — Other Ambulatory Visit: Payer: Self-pay

## 2022-04-21 MED ORDER — FLUOXETINE HCL 20 MG PO TABS: 30.0000 mg | ORAL_TABLET | Freq: Every day | ORAL | 0 refills | Status: DC

## 2022-05-05 ENCOUNTER — Other Ambulatory Visit: Payer: Self-pay | Admitting: Internal Medicine

## 2022-05-09 ENCOUNTER — Ambulatory Visit (INDEPENDENT_AMBULATORY_CARE_PROVIDER_SITE_OTHER): Payer: Medicare HMO | Admitting: Internal Medicine

## 2022-05-09 ENCOUNTER — Encounter: Payer: Self-pay | Admitting: Internal Medicine

## 2022-05-09 VITALS — BP 126/86 | HR 53 | Temp 97.7°F | Resp 18 | Ht 60.0 in | Wt 154.1 lb

## 2022-05-09 DIAGNOSIS — R1013 Epigastric pain: Secondary | ICD-10-CM

## 2022-05-09 DIAGNOSIS — Z0001 Encounter for general adult medical examination with abnormal findings: Secondary | ICD-10-CM | POA: Diagnosis not present

## 2022-05-09 DIAGNOSIS — I10 Essential (primary) hypertension: Secondary | ICD-10-CM

## 2022-05-09 DIAGNOSIS — Z0000 Encounter for general adult medical examination without abnormal findings: Secondary | ICD-10-CM

## 2022-05-09 DIAGNOSIS — Z1231 Encounter for screening mammogram for malignant neoplasm of breast: Secondary | ICD-10-CM

## 2022-05-09 DIAGNOSIS — M109 Gout, unspecified: Secondary | ICD-10-CM | POA: Diagnosis not present

## 2022-05-09 DIAGNOSIS — E785 Hyperlipidemia, unspecified: Secondary | ICD-10-CM

## 2022-05-09 DIAGNOSIS — R739 Hyperglycemia, unspecified: Secondary | ICD-10-CM

## 2022-05-09 DIAGNOSIS — Z01419 Encounter for gynecological examination (general) (routine) without abnormal findings: Secondary | ICD-10-CM

## 2022-05-09 NOTE — Progress Notes (Unsigned)
Subjective:    Patient ID: Amy Owens, female    DOB: [birth year], 67 y.o.   MRN: [id number]  DOS:  05/09/2022 Type of visit - description: CPX  Here for CPX Still has GI symptoms including some nausea and dyspepsia but no blood in the stools. + Stress related to her brother's health. Has been feeling poorly on and off typically when her BP is low, at times in the 90s or 80s.  She has been skipping BP medications from time to time.  Review of Systems See above   Past Medical History:  Diagnosis Date   Anxiety    Chest pain    atypical, neg stress test 2009   DM (diabetes mellitus) ([place])    borderline    Hyperlipidemia    Hypertension    Insomnia    sleep disorder unspec   Migraine headache    Perimenopausal     Past Surgical History:  Procedure Laterality Date   CESAREAN SECTION     COLONOSCOPY W/ POLYPECTOMY  2006   KNEE ARTHROSCOPY  2007   TUBAL LIGATION      Current Outpatient Medications  Medication Instructions   allopurinol (ZYLOPRIM) 100 mg, Oral, Daily   aspirin (BABY ASPIRIN) 81 mg, Oral, Daily   cloNIDine (CATAPRES) 0.2 mg, Oral, Daily   colchicine 0.6 mg, Oral, 2 times daily PRN   Eszopiclone 3 mg, Oral, Daily at bedtime   FLUoxetine (PROZAC) 30 mg, Oral, Daily   meloxicam (MOBIC) 15 MG tablet TAKE 1 TABLET(15 MG) BY MOUTH DAILY   metoprolol tartrate (LOPRESSOR) 100 MG tablet TAKE ONE TABLET BY MOUTH EVERY MORNING AND TAKE 1 AND 1/2 TABLET BY MOUTH EVERY EVENING   olmesartan (BENICAR) 20 mg, Oral, Daily   omeprazole (PRILOSEC) 40 mg, Oral, Daily before breakfast   predniSONE (DELTASONE) 10 MG tablet 3 tabs x 3 days, 2 tabs x 3 days, 1 tab x 3 days   rosuvastatin (CRESTOR) 20 mg, Oral, Daily at bedtime   triamterene-hydrochlorothiazide (DYAZIDE) 37.5-25 MG capsule 1 capsule, Oral, Every morning       Objective:   Physical Exam BP 126/86   Pulse (!) 53   Temp 97.7 F (36.5 C) (Oral)   Resp 18   Ht 5' (1.524 m)   Wt 154 lb 2 oz  (69.9 kg)   SpO2 97%   BMI 30.10 kg/m  General: Well developed, NAD, BMI noted Neck: No  thyromegaly  HEENT:  Normocephalic . Face symmetric, atraumatic Lungs:  CTA B Normal respiratory effort, no intercostal retractions, no accessory muscle use. Heart: RRR,  no murmur.  Abdomen:  Not distended, soft, non-tender. No rebound or rigidity.   Lower extremities: no pretibial edema bilaterally  Skin: Exposed areas without rash. Not pale. Not jaundice Neurologic:  alert & oriented X3.  Speech normal, gait appropriate for age and unassisted Strength symmetric and appropriate for age.  Psych: Cognition and judgment appear intact.  Cooperative with normal attention span and concentration.  Behavior appropriate. No anxious or depressed appearing.      Assessment   Assessment Prediabetes  HTN Hyperlipidemia GERD Gout (podagra 06-2021, see OV note 09-05-2021) anxiety, insomnia: --Intolerant to [place] 2014 (HAs), lunesta ok  --rx prozac 01-2015, good results H/o  migraines H/o CP (-) stress test 2009 +FH CAD, DM     PLAN Here for CPX Prediabetes: Reports she is not doing well with diet, advised provided HTN: Currently on chlorthalidone at nighttime, Lopressor, Benicar and Dyazide.  Sometimes she has been  feeling poorly BP is noted to be low, at times less than 90.  She has been skipping medication on and off. Plan: Stop clonidine, continue checking BPs, if BP continues to be low she is to let me know, we will continue adjusting her medications.  Checking labs Hyperlipidemia: On rosuvastatin, check other labs. Dyspepsia, GERD, history of H. pylori: Has ongoing nausea and dyspepsia, already talked with GI, to have an EGD along with a colonoscopy soon. Gout: Good compliance with medication, no recent events, checking a uric acid. Anxiety insomnia: Under a lot of stress, her brother is not doing well, listening therapy provided. RTC 6 months  - Tdap 2014 - zostavax 2014;  s/p  Shingrex  - PNM 13: 2020 ; PNM 23 : 2021 - s/p covid vax  booster  - RSV rec  - had a flu shot   -- female care: PAP 12/2016; [facility]G 03/2021 ([facility]) plans to proceed w/ a f/u MMG --Cscope 2006: tubular adenomas, Cscope again 06-2011, 1 polyp, not recovered; cscope 11-2016: wnl, next due, schedule for May 31 2022  -- diet-exercise: Discussed. --bones: T Score 11-2017: -1.1.  Consider DEXA next year, on vitamin D. --labs: BMP AST ALT uric acid cholesterol CBC A1c - Health care POA: See AVS  ====  H. pylori infection: See last visit, she was having epigastric burning, after holding PPIs H. pylori test came back +, took Pylera, pain is better but not completely gone, still has epigastric burning.  Plan: Continue PPIs daily, refer to GI, EGD questionable Gout: May 2023 >> developed swelling at the base of the L great toe consistent with podagra.  Saw podiatry, uric acid level elevated, started allopurinol 100 mg, good tolerance, pain slightly better, still have some residual inflammation. Plan: Check uric acid, refill allopurinol, round of prednisone, colchicine as needed, see AVS High cholesterol: At the last visit, Crestor dose increased, check FLP. HTN: Well-controlled on clonidine, metoprolol, olmesartan, Dyazide. RTC 6 months

## 2022-05-09 NOTE — Patient Instructions (Addendum)
Vaccines I recommend:  Tdap (tetanus)  Stop clonidine Continue checking your blood pressures regularly BP GOAL is between 110/65 and  135/85. If your blood pressures continue to be low (below 110) please let me know.   We are referring you to a gynecologist in this building. You can reach them at [facility] : Get the blood work     [place], [place] back for checkup in 6 months    "[place] of attorney" ,  "Living will" (Advance care planning documents)  If you already have a living will or healthcare power of attorney, is recommended you bring the copy to be scanned in your chart.   The document will be available to all the doctors you see in the system.  Advance care planning is a process that supports adults in  understanding and sharing their preferences regarding future medical care.  The patient's preferences are recorded in documents called Advance Directives and the can be modified at any time while the patient is in full mental capacity.   If you don't have one, please consider create one.      More information at: [email or web address]

## 2022-05-10 LAB — CBC WITH DIFFERENTIAL/PLATELET
Basophils Absolute: 0 10*3/uL (ref 0.0–0.1)
Basophils Relative: 0.6 % (ref 0.0–3.0)
Eosinophils Absolute: 0 10*3/uL (ref 0.0–0.7)
Eosinophils Relative: 0.3 % (ref 0.0–5.0)
HCT: 39.1 % (ref 36.0–46.0)
Hemoglobin: 13 g/dL (ref 12.0–15.0)
Lymphocytes Relative: 34.1 % (ref 12.0–46.0)
Lymphs Abs: 2.4 10*3/uL (ref 0.7–4.0)
MCHC: 33.2 g/dL (ref 30.0–36.0)
MCV: 94.1 fl (ref 78.0–100.0)
Monocytes Absolute: 0.5 10*3/uL (ref 0.1–1.0)
Monocytes Relative: 7.5 % (ref 3.0–12.0)
Neutro Abs: 4 10*3/uL (ref 1.4–7.7)
Neutrophils Relative %: 57.5 % (ref 43.0–77.0)
Platelets: 208 10*3/uL (ref 150.0–400.0)
RBC: 4.15 Mil/uL (ref 3.87–5.11)
RDW: 13.4 % (ref 11.5–15.5)
WBC: 7 10*3/uL (ref 4.0–10.5)

## 2022-05-10 LAB — HEMOGLOBIN A1C: Hgb A1c MFr Bld: 6.1 % (ref 4.6–6.5)

## 2022-05-10 LAB — LIPID PANEL
Cholesterol: 144 mg/dL (ref 0–200)
HDL: 49.3 mg/dL (ref >39.00)
LDL Cholesterol: 72 mg/dL (ref 0–99)
NonHDL: 94.77
Total CHOL/HDL Ratio: 3
Triglycerides: 113 mg/dL (ref 0.0–149.0)
VLDL: 22.6 mg/dL (ref 0.0–40.0)

## 2022-05-10 LAB — BASIC METABOLIC PANEL
BUN: 36 mg/dL — ABNORMAL HIGH (ref 6–23)
CO2: 29 mEq/L (ref 19–32)
Calcium: 10 mg/dL (ref 8.4–10.5)
Chloride: 104 mEq/L (ref 96–112)
Creatinine, Ser: 1.36 mg/dL — ABNORMAL HIGH (ref 0.40–1.20)
GFR: 40.04 mL/min — ABNORMAL LOW (ref >60.00)
Glucose, Bld: 94 mg/dL (ref 70–99)
Potassium: 4.6 mEq/L (ref 3.5–5.1)
Sodium: 142 mEq/L (ref 135–145)

## 2022-05-10 LAB — ALT: ALT: 22 U/L (ref 0–35)

## 2022-05-10 LAB — URIC ACID: Uric Acid, Serum: 6.6 mg/dL (ref 2.4–7.0)

## 2022-05-10 LAB — AST: AST: 21 U/L (ref 0–37)

## 2022-05-11 ENCOUNTER — Encounter: Payer: Self-pay | Admitting: Internal Medicine

## 2022-05-11 NOTE — Assessment & Plan Note (Signed)
-   Tdap 2014.  Booster recommended. - zostavax 2014;  s/p Shingrex  - PNM 13: 2020 ; PNM 23 : 2021 - s/p covid vax  booster  - RSV rec  - had a flu shot   -- female care: PAP 12/2016; MMG 03/2021 ([facility]) plans to proceed w/ a f/u MMG --Cscope 2006: tubular adenomas, Cscope again 06-2011, 1 polyp, not recovered; cscope 11-2016: wnl, next due, schedule for May 31 2022  -- diet-exercise: Discussed. --bones: T Score 11-2017: -1.1.  Consider DEXA next year, on vitamin D. --labs: BMP AST ALT uric acid cholesterol CBC A1c - Health care POA: See AVS

## 2022-05-11 NOTE — Assessment & Plan Note (Signed)
Here for CPX Chronic problems were addressed. Prediabetes: Reports she is not doing well with diet, advised provided HTN: Currently on clonidine (qhs), Lopressor, Benicar and Dyazide.  Sometimes she has been feeling poorly BP is noted to be low, at times SBP less than 90.  She has been skipping medication on and off. Plan: Stop clonidine, continue checking BPs, if BP continues to be low she is to let me know, we will continue adjusting her medications.  Checking labs Hyperlipidemia: On rosuvastatin, check other labs. Dyspepsia, GERD, history of H. pylori: Has ongoing nausea and dyspepsia, already talked with GI, to have an EGD along with a colonoscopy soon. Gout: Good compliance with medication, no recent events, checking a uric acid. Anxiety insomnia: Under a lot of stress, her brother is not doing well, listening therapy provided. RTC 6 months

## 2022-05-12 NOTE — Addendum Note (Signed)
Addended byDamita Dunnings D on: 05/12/2022 07:56 AM   Modules accepted: Orders

## 2022-05-17 ENCOUNTER — Encounter: Payer: Self-pay | Admitting: Internal Medicine

## 2022-05-17 DIAGNOSIS — H524 Presbyopia: Secondary | ICD-10-CM | POA: Diagnosis not present

## 2022-05-17 DIAGNOSIS — H52223 Regular astigmatism, bilateral: Secondary | ICD-10-CM | POA: Diagnosis not present

## 2022-05-17 DIAGNOSIS — H2513 Age-related nuclear cataract, bilateral: Secondary | ICD-10-CM | POA: Diagnosis not present

## 2022-05-17 DIAGNOSIS — H5203 Hypermetropia, bilateral: Secondary | ICD-10-CM | POA: Diagnosis not present

## 2022-05-17 MED ORDER — CLONIDINE HCL 0.1 MG PO TABS: 0.1000 mg | ORAL_TABLET | Freq: Every day | ORAL | 3 refills | Status: DC

## 2022-05-27 ENCOUNTER — Other Ambulatory Visit: Payer: Self-pay | Admitting: Internal Medicine

## 2022-05-28 ENCOUNTER — Other Ambulatory Visit: Payer: Self-pay | Admitting: Internal Medicine

## 2022-05-31 DIAGNOSIS — K2970 Gastritis, unspecified, without bleeding: Secondary | ICD-10-CM | POA: Diagnosis not present

## 2022-05-31 DIAGNOSIS — K219 Gastro-esophageal reflux disease without esophagitis: Secondary | ICD-10-CM | POA: Diagnosis not present

## 2022-05-31 DIAGNOSIS — K562 Volvulus: Secondary | ICD-10-CM | POA: Diagnosis not present

## 2022-05-31 DIAGNOSIS — D123 Benign neoplasm of transverse colon: Secondary | ICD-10-CM | POA: Diagnosis not present

## 2022-05-31 DIAGNOSIS — D126 Benign neoplasm of colon, unspecified: Secondary | ICD-10-CM | POA: Diagnosis not present

## 2022-05-31 DIAGNOSIS — R1013 Epigastric pain: Secondary | ICD-10-CM | POA: Diagnosis not present

## 2022-05-31 DIAGNOSIS — Z1211 Encounter for screening for malignant neoplasm of colon: Secondary | ICD-10-CM | POA: Diagnosis not present

## 2022-05-31 DIAGNOSIS — K648 Other hemorrhoids: Secondary | ICD-10-CM | POA: Diagnosis not present

## 2022-05-31 DIAGNOSIS — K319 Disease of stomach and duodenum, unspecified: Secondary | ICD-10-CM | POA: Diagnosis not present

## 2022-05-31 DIAGNOSIS — K635 Polyp of colon: Secondary | ICD-10-CM | POA: Diagnosis not present

## 2022-05-31 DIAGNOSIS — K31A19 Gastric intestinal metaplasia without dysplasia, unspecified site: Secondary | ICD-10-CM | POA: Diagnosis not present

## 2022-05-31 DIAGNOSIS — K3189 Other diseases of stomach and duodenum: Secondary | ICD-10-CM | POA: Diagnosis not present

## 2022-05-31 DIAGNOSIS — Z86010 Personal history of colonic polyps: Secondary | ICD-10-CM | POA: Diagnosis not present

## 2022-05-31 DIAGNOSIS — D122 Benign neoplasm of ascending colon: Secondary | ICD-10-CM | POA: Diagnosis not present

## 2022-05-31 LAB — HM COLONOSCOPY

## 2022-06-01 ENCOUNTER — Encounter: Payer: Self-pay | Admitting: Internal Medicine

## 2022-06-07 ENCOUNTER — Other Ambulatory Visit: Payer: Self-pay | Admitting: Family Medicine

## 2022-06-07 MED ORDER — ESZOPICLONE 3 MG PO TABS: 3.0000 mg | ORAL_TABLET | Freq: Every day | ORAL | 0 refills | Status: DC

## 2022-06-07 NOTE — Telephone Encounter (Signed)
Requesting: eszopiclone 3mg  Contract: No UDS: no Last Visit: 05/09/2022 Next Visit: 05/27/2022 Last Refill: 11/07/21  Please Advise

## 2022-06-14 ENCOUNTER — Encounter: Payer: Self-pay | Admitting: Internal Medicine

## 2022-06-14 ENCOUNTER — Other Ambulatory Visit (INDEPENDENT_AMBULATORY_CARE_PROVIDER_SITE_OTHER): Payer: Medicare HMO

## 2022-06-14 DIAGNOSIS — I10 Essential (primary) hypertension: Secondary | ICD-10-CM

## 2022-06-14 LAB — BASIC METABOLIC PANEL
BUN: 32 mg/dL — ABNORMAL HIGH (ref 6–23)
CO2: 29 mEq/L (ref 19–32)
Calcium: 9.7 mg/dL (ref 8.4–10.5)
Chloride: 101 mEq/L (ref 96–112)
Creatinine, Ser: 0.94 mg/dL (ref 0.40–1.20)
GFR: 62.32 mL/min (ref >60.00)
Glucose, Bld: 104 mg/dL — ABNORMAL HIGH (ref 70–99)
Potassium: 4.2 mEq/L (ref 3.5–5.1)
Sodium: 142 mEq/L (ref 135–145)

## 2022-06-20 DIAGNOSIS — Z86010 Personal history of colonic polyps: Secondary | ICD-10-CM | POA: Diagnosis not present

## 2022-06-20 DIAGNOSIS — K219 Gastro-esophageal reflux disease without esophagitis: Secondary | ICD-10-CM | POA: Diagnosis not present

## 2022-06-20 DIAGNOSIS — E669 Obesity, unspecified: Secondary | ICD-10-CM | POA: Diagnosis not present

## 2022-06-20 DIAGNOSIS — R110 Nausea: Secondary | ICD-10-CM | POA: Diagnosis not present

## 2022-06-21 ENCOUNTER — Ambulatory Visit
Admission: RE | Admit: 2022-06-21 | Discharge: 2022-06-21 | Disposition: A | Discharge status: Home / Self Care | Payer: Medicare HMO | Source: Ambulatory Visit | Attending: Internal Medicine | Admitting: Internal Medicine

## 2022-06-21 DIAGNOSIS — Z1231 Encounter for screening mammogram for malignant neoplasm of breast: Secondary | ICD-10-CM | POA: Diagnosis not present

## 2022-07-05 ENCOUNTER — Encounter: Payer: Self-pay | Admitting: Internal Medicine

## 2022-07-15 ENCOUNTER — Other Ambulatory Visit: Payer: Self-pay | Admitting: Internal Medicine

## 2022-09-03 ENCOUNTER — Telehealth: Payer: Self-pay | Admitting: Family Medicine

## 2022-09-04 NOTE — Telephone Encounter (Signed)
Requesting: eszopiclone 3mg   Contract: 02/02/20 UDS: None Last Visit: 05/09/22 Next Visit: None Last Refill: 06/07/2022 #90 and 0RF   Please Advise

## 2022-09-04 NOTE — Telephone Encounter (Signed)
PDMP okay, Rx sent 

## 2022-09-13 ENCOUNTER — Other Ambulatory Visit: Payer: Self-pay | Admitting: Internal Medicine

## 2022-10-14 ENCOUNTER — Other Ambulatory Visit: Payer: Self-pay | Admitting: Podiatry

## 2022-11-12 ENCOUNTER — Other Ambulatory Visit: Payer: Self-pay | Admitting: Internal Medicine

## 2022-12-05 ENCOUNTER — Other Ambulatory Visit: Payer: Self-pay | Admitting: Internal Medicine

## 2022-12-06 ENCOUNTER — Other Ambulatory Visit: Payer: Self-pay | Admitting: Internal Medicine

## 2022-12-20 ENCOUNTER — Other Ambulatory Visit: Payer: Self-pay | Admitting: Internal Medicine

## 2023-01-03 ENCOUNTER — Other Ambulatory Visit: Payer: Self-pay | Admitting: Internal Medicine

## 2023-01-09 ENCOUNTER — Telehealth: Payer: Self-pay

## 2023-01-09 MED ORDER — ESZOPICLONE 3 MG PO TABS: 3.0000 mg | ORAL_TABLET | Freq: Every day | ORAL | 1 refills | Status: DC

## 2023-01-09 NOTE — Telephone Encounter (Signed)
Requesting: Eszopiclone 3mg   Contract: 02/02/20 UDS: None Last Visit: 05/09/22 Next Visit: 01/17/23 Last Refill: 09/04/22 #90 and 0RF   Please Advise

## 2023-01-09 NOTE — Telephone Encounter (Signed)
PDMP okay, Rx sent 

## 2023-01-10 ENCOUNTER — Telehealth: Payer: Self-pay

## 2023-01-10 NOTE — Telephone Encounter (Signed)
PA approved.   PA Case: [id number], Status: Approved, Coverage Starts on: 02/27/2022 12:00:00 AM, Coverage Ends on: 02/27/2024 12:00:00 AM. Questions? Contact [phone]. Authorization Expiration Date: 02/26/2024

## 2023-01-10 NOTE — Telephone Encounter (Signed)
PA initiated via Covermymeds; KEY: [id number]. Awaiting determination.

## 2023-01-14 ENCOUNTER — Other Ambulatory Visit: Payer: Self-pay | Admitting: Internal Medicine

## 2023-01-17 ENCOUNTER — Other Ambulatory Visit: Payer: Self-pay | Admitting: Internal Medicine

## 2023-01-17 ENCOUNTER — Encounter: Payer: Self-pay | Admitting: Internal Medicine

## 2023-01-17 ENCOUNTER — Ambulatory Visit: Payer: Medicare HMO | Admitting: Internal Medicine

## 2023-01-17 VITALS — BP 128/64 | HR 55 | Temp 98.4°F | Resp 16 | Ht 60.0 in | Wt 159.0 lb

## 2023-01-17 DIAGNOSIS — R739 Hyperglycemia, unspecified: Secondary | ICD-10-CM | POA: Diagnosis not present

## 2023-01-17 DIAGNOSIS — I10 Essential (primary) hypertension: Secondary | ICD-10-CM

## 2023-01-17 DIAGNOSIS — Z23 Encounter for immunization: Secondary | ICD-10-CM

## 2023-01-17 DIAGNOSIS — G4700 Insomnia, unspecified: Secondary | ICD-10-CM | POA: Diagnosis not present

## 2023-01-17 DIAGNOSIS — F411 Generalized anxiety disorder: Secondary | ICD-10-CM | POA: Diagnosis not present

## 2023-01-17 MED ORDER — FLUOXETINE HCL 20 MG PO TABS
ORAL_TABLET | ORAL | 1 refills | Status: DC
Start: 2023-01-17 — End: 2023-04-30

## 2023-01-17 MED ORDER — FLUOXETINE HCL 20 MG PO TABS
ORAL_TABLET | ORAL | 1 refills | Status: DC
Start: 2023-01-17 — End: 2023-01-17

## 2023-01-17 NOTE — Patient Instructions (Addendum)
Fluoxetine 20 mg: Increase to 2 tablets daily for 2 weeks Then increase to 2.5 tablets. If you think you need slightly higher dose please let me know.    Check the  blood pressure regularly Blood pressure goal:  between 110/65 and  135/85. If it is consistently higher or lower, let me know     GO TO THE LAB : Get the blood work     Next visit with me in 3 months.     Please schedule it at the front desk

## 2023-01-17 NOTE — Progress Notes (Signed)
Subjective:    Patient ID: Amy Owens, female    DOB: 13-Nov-1955, 67 y.o.   MRN: [id number]  DOS:  01/17/2023 Type of visit - description: rov  Since the last office visit, lost a brother >> acute  MI. Obviously stressed anxious. Ambulatory BPs when checked normal.   Review of Systems See above   Past Medical History:  Diagnosis Date   Anxiety    Chest pain    atypical, neg stress test 2009   DM (diabetes mellitus) (HCC)    borderline    Hyperlipidemia    Hypertension    Insomnia    sleep disorder unspec   Migraine headache    Perimenopausal     Past Surgical History:  Procedure Laterality Date   CESAREAN SECTION     COLONOSCOPY W/ POLYPECTOMY  2006   KNEE ARTHROSCOPY  2007   TUBAL LIGATION      Current Outpatient Medications  Medication Instructions   allopurinol (ZYLOPRIM) 100 mg, Oral, Daily   aspirin (BABY ASPIRIN) 81 mg, Oral, Daily   cloNIDine (CATAPRES) 0.1 mg, Oral, Daily at bedtime   colchicine 0.6 mg, Oral, 2 times daily PRN   Eszopiclone 3 mg, Oral, Daily at bedtime   FLUoxetine (PROZAC) 30 mg, Oral, Daily   metoprolol tartrate (LOPRESSOR) 100 MG tablet Take 1 tablet (100 mg total) by mouth in the morning AND 1.5 tablets (150 mg total) every evening.   olmesartan (BENICAR) 20 mg, Oral, Daily   omeprazole (PRILOSEC) 40 mg, Oral, Daily before breakfast   rosuvastatin (CRESTOR) 20 mg, Oral, Daily at bedtime   triamterene-hydrochlorothiazide (DYAZIDE) 37.5-25 MG capsule 1 capsule, Oral, Every morning       Objective:   Physical Exam BP 128/64   Pulse (!) 55   Temp 98.4 F (36.9 C) (Oral)   Resp 16   Ht 5' (1.524 m)   Wt 159 lb (72.1 kg)   SpO2 96%   BMI 31.05 kg/m  General:   Well developed, NAD, BMI noted. HEENT:  Normocephalic . Face symmetric, atraumatic Lungs:  CTA B Normal respiratory effort, no intercostal retractions, no accessory muscle use. Heart: RRR,  no murmur.  Lower extremities: no pretibial edema  bilaterally  Skin: Not pale. Not jaundice Neurologic:  alert & oriented X3.  Speech normal, gait appropriate for age and unassisted Psych--  Cognition and judgment appear intact.  Cooperative with normal attention span and concentration.  Behavior appropriate. Tearful when we talk about her brother.     Assessment     Assessment Prediabetes  HTN Hyperlipidemia GERD Gout (podagra 06-2021, see OV note 09-05-2021) anxiety, insomnia: --Intolerant to [place] 2014 (HAs), lunesta ok  --rx prozac 01-2015, good results H/o  migraines H/o CP (-) stress test 2009 +FH CAD, DM     PLAN HTN: After LOV, creatinine was 1.3, subsequently rechecked and was 0.9.  BP today is very good, reported normal ambulatory BPs, continue clonidine, metoprolol, Benicar, Dyazide.  Recheck BMP. Anxiety, insomnia: lost her brother few months ago, stress has increased significantly, she is taking care of paperwork related to his passing.  Tearful today.  Listening therapy provided. We both agree on increase fluoxetine to 40 mg, then 50 mg, then reassess.  Note: It may increase levels of metoprolol, recommend to watch BP closely Continue eszociplon Consider counseling, she is not ready at this point High cholesterol: Well-controlled.  Continue Crestor.  Recheck in few months. GERD: S/ p EGD 05/31/2022, diffuse gastritis, enlarged gastric fold at the pillars,  biopsied. See full report.  BX: Reactive gastropathy with intestinal metaplasia. Preventive care; Colonoscopy 05/31/2022: Tubular adenomas.  Next per GI Reports she had a recent COVID-vaccine, flu shot today. RTC 3 months.

## 2023-01-18 LAB — BASIC METABOLIC PANEL
BUN: 29 mg/dL — ABNORMAL HIGH (ref 6–23)
CO2: 28 meq/L (ref 19–32)
Calcium: 10.1 mg/dL (ref 8.4–10.5)
Chloride: 102 meq/L (ref 96–112)
Creatinine, Ser: 1.19 mg/dL (ref 0.40–1.20)
GFR: 46.77 mL/min — ABNORMAL LOW (ref >60.00)
Glucose, Bld: 95 mg/dL (ref 70–99)
Potassium: 4.9 meq/L (ref 3.5–5.1)
Sodium: 140 meq/L (ref 135–145)

## 2023-01-18 LAB — HEMOGLOBIN A1C: Hgb A1c MFr Bld: 6.1 % (ref 4.6–6.5)

## 2023-01-18 MED ORDER — METOPROLOL TARTRATE 100 MG PO TABS: ORAL_TABLET | ORAL | 1 refills | Status: AC

## 2023-01-18 MED ORDER — CLONIDINE HCL 0.1 MG PO TABS: 0.1000 mg | ORAL_TABLET | Freq: Every day | ORAL | 1 refills | Status: DC

## 2023-01-19 NOTE — Assessment & Plan Note (Signed)
HTN: After LOV, creatinine was 1.3, subsequently rechecked and was 0.9.  BP today is very good, reported normal ambulatory BPs, continue clonidine, metoprolol, Benicar, Dyazide.  Recheck BMP. Anxiety, insomnia: lost her brother few months ago, stress has increased significantly, she is taking care of paperwork related to his passing.  Tearful today.  Listening therapy provided. We both agree on increase fluoxetine to 40 mg, then 50 mg, then reassess.  Note: It may increase levels of metoprolol, recommend to watch BP closely Continue eszociplon Consider counseling, she is not ready at this point High cholesterol: Well-controlled.  Continue Crestor.  Recheck in few months. GERD: S/ p EGD 05/31/2022, diffuse gastritis, enlarged gastric fold at the pillars, biopsied. See full report.  BX: Reactive gastropathy with intestinal metaplasia. Preventive care; Colonoscopy 05/31/2022: Tubular adenomas.  Next per GI Reports she had a recent COVID-vaccine, flu shot today. RTC 3 months.

## 2023-01-24 ENCOUNTER — Other Ambulatory Visit: Payer: Self-pay | Admitting: Internal Medicine

## 2023-04-06 ENCOUNTER — Other Ambulatory Visit: Payer: Self-pay | Admitting: Internal Medicine

## 2023-04-20 ENCOUNTER — Encounter: Payer: Self-pay | Admitting: Internal Medicine

## 2023-04-20 ENCOUNTER — Ambulatory Visit (INDEPENDENT_AMBULATORY_CARE_PROVIDER_SITE_OTHER): Payer: Medicare Other | Admitting: Internal Medicine

## 2023-04-20 VITALS — BP 132/68 | HR 52 | Temp 97.8°F | Resp 16 | Ht 60.0 in | Wt 158.4 lb

## 2023-04-20 DIAGNOSIS — F411 Generalized anxiety disorder: Secondary | ICD-10-CM

## 2023-04-20 DIAGNOSIS — G4700 Insomnia, unspecified: Secondary | ICD-10-CM | POA: Diagnosis not present

## 2023-04-20 DIAGNOSIS — I10 Essential (primary) hypertension: Secondary | ICD-10-CM | POA: Diagnosis not present

## 2023-04-20 DIAGNOSIS — M109 Gout, unspecified: Secondary | ICD-10-CM | POA: Diagnosis not present

## 2023-04-20 DIAGNOSIS — Z79899 Other long term (current) drug therapy: Secondary | ICD-10-CM | POA: Diagnosis not present

## 2023-04-20 MED ORDER — HYDROCHLOROTHIAZIDE 12.5 MG PO TABS: 12.5000 mg | ORAL_TABLET | Freq: Every day | ORAL | 6 refills | Status: DC

## 2023-04-20 NOTE — Progress Notes (Signed)
   Subjective:    Patient ID: Amy Owens, female    DOB: 1956/02/21, 68 y.o.   MRN: [id number]  DOS:  04/20/2023 Type of visit - description: Follow-up from previous visit  Was seen with anxiety, overall improved. Anxiety has decreased, denies depression. Still has some difficulty sleeping. Occasional headaches, mild, not persistent. Has noted her BPs to be lower than before.  Review of Systems See above   Past Medical History:  Diagnosis Date   Anxiety    Chest pain    atypical, neg stress test 2009   DM (diabetes mellitus) (HCC)    borderline    Hyperlipidemia    Hypertension    Insomnia    sleep disorder unspec   Migraine headache    Perimenopausal     Past Surgical History:  Procedure Laterality Date   CESAREAN SECTION     COLONOSCOPY W/ POLYPECTOMY  2006   KNEE ARTHROSCOPY  2007   TUBAL LIGATION      Current Outpatient Medications  Medication Instructions   allopurinol (ZYLOPRIM) 100 mg, Oral, Daily   aspirin (BABY ASPIRIN) 81 mg, Oral, Daily   cloNIDine (CATAPRES) 0.1 mg, Oral, Daily at bedtime   colchicine 0.6 mg, Oral, 2 times daily PRN   Eszopiclone 3 mg, Oral, Daily at bedtime   FLUoxetine (PROZAC) 20 MG tablet Take 2 tablets for 2 weeks, then 2.5 tablets   metoprolol tartrate (LOPRESSOR) 100 MG tablet Take 1 tablet (100 mg total) by mouth in the morning AND 1.5 tablets (150 mg total) every evening.   olmesartan (BENICAR) 20 mg, Oral, Daily   omeprazole (PRILOSEC) 40 mg, Oral, Daily before breakfast   rosuvastatin (CRESTOR) 20 mg, Oral, Daily at bedtime   triamterene-hydrochlorothiazide (DYAZIDE) 37.5-25 MG capsule 1 capsule, Oral, Every morning       Objective:   Physical Exam BP 132/68   Pulse (!) 52   Temp 97.8 F (36.6 C) (Oral)   Resp 16   Ht 5' (1.524 m)   Wt 158 lb 6 oz (71.8 kg)   SpO2 97%   BMI 30.93 kg/m  General:   Well developed, NAD, BMI noted. HEENT:  Normocephalic . Face symmetric, atraumatic Lower  extremities: no pretibial edema bilaterally  Skin: Not pale. Not jaundice Neurologic:  alert & oriented X3.  Speech normal, gait appropriate for age and unassisted Psych--  Cognition and judgment appear intact.  Cooperative with normal attention span and concentration.  Behavior appropriate. No anxious or depressed appearing.      Assessment      Assessment Prediabetes  HTN Hyperlipidemia GERD Gout (podagra 06-2021, see OV note 09-05-2021) anxiety, insomnia: --Intolerant to [place] 2014 (HAs), lunesta ok  --rx prozac 01-2015, good results H/o  migraines H/o CP (-) stress test 2009 +FH CAD, DM     PLAN Anxiety, insomnia: Since last visit, fluoxetine dose increased, she feels better. Still has mild insomnia.  We agreed on no change at this point, cont Eszociplon HTN: Since the last visit, ambulatory BPs have decreased: 102/57, 102/63, 111/65. Plan: Change Dyazide to HCTZ 12.5 mg daily, BMP in 2 weeks. Continue clonidine, metoprolol, Benicar. BMP in 2 weeks. Gout: due to change in diuretics rec colchicine every day x few days Mild headache: Not persisting, we agreed on observation. RTC 3 months CPX

## 2023-04-20 NOTE — Patient Instructions (Addendum)
Stop triamterene hydrochlorothiazide  Take hydrochlorothiazide 12.5 mg 1 tablet daily  For the next few days take colchicine every day   Continue checking your blood pressure regularly Blood pressure goal:  between 110/65 and  135/85. If it is consistently higher or lower, let me know        Arrange for blood work to be done in 2 weeks Arrange for a physical exam in 2 to 3 months from today. Please schedule it at the front desk

## 2023-04-22 NOTE — Assessment & Plan Note (Signed)
 Anxiety, insomnia: Since last visit, fluoxetine dose increased, she feels better. Still has mild insomnia.  We agreed on no change at this point, cont Eszociplon HTN: Since the last visit, ambulatory BPs have decreased: 102/57, 102/63, 111/65. Plan: Change Dyazide to HCTZ 12.5 mg daily, BMP in 2 weeks. Continue clonidine, metoprolol, Benicar. BMP in 2 weeks. Gout: due to change in diuretics rec colchicine every day x few days Mild headache: Not persisting, we agreed on observation. RTC 3 months CPX

## 2023-04-30 ENCOUNTER — Other Ambulatory Visit: Payer: Self-pay | Admitting: Internal Medicine

## 2023-05-04 ENCOUNTER — Other Ambulatory Visit (INDEPENDENT_AMBULATORY_CARE_PROVIDER_SITE_OTHER): Payer: Medicare Other

## 2023-05-04 DIAGNOSIS — F411 Generalized anxiety disorder: Secondary | ICD-10-CM

## 2023-05-04 DIAGNOSIS — Z79899 Other long term (current) drug therapy: Secondary | ICD-10-CM

## 2023-05-04 DIAGNOSIS — G4700 Insomnia, unspecified: Secondary | ICD-10-CM | POA: Diagnosis not present

## 2023-05-04 DIAGNOSIS — I10 Essential (primary) hypertension: Secondary | ICD-10-CM

## 2023-05-04 LAB — BASIC METABOLIC PANEL
BUN: 22 mg/dL (ref 6–23)
CO2: 29 meq/L (ref 19–32)
Calcium: 9.7 mg/dL (ref 8.4–10.5)
Chloride: 99 meq/L (ref 96–112)
Creatinine, Ser: 0.94 mg/dL (ref 0.40–1.20)
GFR: 61.94 mL/min (ref >60.00)
Glucose, Bld: 95 mg/dL (ref 70–99)
Potassium: 4.2 meq/L (ref 3.5–5.1)
Sodium: 140 meq/L (ref 135–145)

## 2023-05-06 ENCOUNTER — Encounter: Payer: Self-pay | Admitting: Internal Medicine

## 2023-05-06 LAB — DRUG MONITORING PANEL 375977 , URINE

## 2023-05-06 LAB — DM TEMPLATE

## 2023-06-06 ENCOUNTER — Encounter: Payer: Self-pay | Admitting: Internal Medicine

## 2023-06-07 MED ORDER — COLCHICINE 0.6 MG PO TABS
0.6000 mg | ORAL_TABLET | Freq: Two times a day (BID) | ORAL | 1 refills | Status: AC | PRN
Start: 2023-06-07 — End: ?

## 2023-07-10 ENCOUNTER — Encounter: Payer: Self-pay | Admitting: Internal Medicine

## 2023-07-10 ENCOUNTER — Other Ambulatory Visit: Payer: Self-pay | Admitting: Internal Medicine

## 2023-07-18 ENCOUNTER — Encounter: Payer: Self-pay | Admitting: Internal Medicine

## 2023-07-18 ENCOUNTER — Ambulatory Visit (INDEPENDENT_AMBULATORY_CARE_PROVIDER_SITE_OTHER): Payer: Medicare Other | Admitting: Internal Medicine

## 2023-07-18 VITALS — BP 130/68 | HR 51 | Temp 97.8°F | Resp 16 | Ht 60.0 in | Wt 157.1 lb

## 2023-07-18 DIAGNOSIS — I10 Essential (primary) hypertension: Secondary | ICD-10-CM

## 2023-07-18 DIAGNOSIS — M791 Myalgia, unspecified site: Secondary | ICD-10-CM | POA: Diagnosis not present

## 2023-07-18 DIAGNOSIS — R739 Hyperglycemia, unspecified: Secondary | ICD-10-CM

## 2023-07-18 DIAGNOSIS — M109 Gout, unspecified: Secondary | ICD-10-CM

## 2023-07-18 DIAGNOSIS — G4452 New daily persistent headache (NDPH): Secondary | ICD-10-CM

## 2023-07-18 DIAGNOSIS — Z0000 Encounter for general adult medical examination without abnormal findings: Secondary | ICD-10-CM | POA: Diagnosis not present

## 2023-07-18 DIAGNOSIS — Z23 Encounter for immunization: Secondary | ICD-10-CM

## 2023-07-18 DIAGNOSIS — Z1231 Encounter for screening mammogram for malignant neoplasm of breast: Secondary | ICD-10-CM

## 2023-07-18 DIAGNOSIS — E785 Hyperlipidemia, unspecified: Secondary | ICD-10-CM

## 2023-07-18 DIAGNOSIS — Z0001 Encounter for general adult medical examination with abnormal findings: Secondary | ICD-10-CM | POA: Diagnosis not present

## 2023-07-18 DIAGNOSIS — G4700 Insomnia, unspecified: Secondary | ICD-10-CM

## 2023-07-18 NOTE — Patient Instructions (Signed)
 Checking your blood pressure regularly Blood pressure goal:  between 110/65 and  135/85. If it is consistently higher or lower, let me know     GO TO THE LAB :  Get the blood work   Your results will be posted on MyChart with my comments  Next office visit for a checkup in 4 months Please make an appointment before you leave today    STOP BY THE FIRST FLOOR: Schedule mammogram Schedule a bone density test

## 2023-07-18 NOTE — Progress Notes (Signed)
 Subjective:    Patient ID: Amy Owens, female    DOB: 23-Apr-1955, 68 y.o.   MRN: [id number]  DOS:  07/18/2023 Type of visit - description: Here for CPX  Here for CPX.  A number of other issues addressed. HTN, medications were changed at the last visit, since then the majority of readings are WNL.  She did have a couple of gout episodes.     Also reports sporadic headaches, whether her BP is slightly high or normal.  No associated nausea, vomiting, stress.  Also since the last visit she has developed mild generalized myalgias but denies fevers, arthralgias, no visual disturbances.  Review of Systems  Other than above, a 14 point review of systems is negative       Past Medical History:  Diagnosis Date   Anxiety    Chest pain    atypical, neg stress test 2009   DM (diabetes mellitus) (HCC)    borderline    Hyperlipidemia    Hypertension    Insomnia    sleep disorder unspec   Migraine headache    Perimenopausal     Past Surgical History:  Procedure Laterality Date   CESAREAN SECTION     COLONOSCOPY W/ POLYPECTOMY  2006   KNEE ARTHROSCOPY  2007   TUBAL LIGATION     Social History   Socioeconomic History   Marital status: Married    Spouse name: Not on file   Number of children: 3   Years of education: Not on file   Highest education level: Bachelor's degree (e.g., BA, AB, BS)  Occupational History   Occupation: stay home   Tobacco Use   Smoking status: Never   Smokeless tobacco: Never  Substance and Sexual Activity   Alcohol use: No   Drug use: No   Sexual activity: Not on file  Other Topics Concern   Not on file  Social History Narrative   Original from [place]    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       Social Drivers of Corporate investment banker Strain: Low Risk  (04/18/2023)   Overall Financial Resource Strain (CARDIA)    Difficulty of Paying Living Expenses: Not hard at all  Food  Insecurity: No Food Insecurity (04/18/2023)   Hunger Vital Sign    Worried About Running Out of Food in the Last Year: Never true    Ran Out of Food in the Last Year: Never true  Transportation Needs: No Transportation Needs (04/18/2023)   PRAPARE - Administrator, Civil Service (Medical): No    Lack of Transportation (Non-Medical): No  Physical Activity: Unknown (04/18/2023)   Exercise Vital Sign    Days of Exercise per Week: 0 days    Minutes of Exercise per Session: Not on file  Stress: Stress Concern Present (04/18/2023)   [employer] of Occupational Health - Occupational Stress Questionnaire    Feeling of Stress : Rather much  Social Connections: Moderately Isolated (04/18/2023)   Social Connection and Isolation Panel [NHANES]    Frequency of Communication with Friends and Family: Three times a week    Frequency of Social Gatherings with Friends and Family: Once a week    Attends Religious Services: Never    Database administrator or Organizations: No    Attends Engineer, structural: Not on file    Marital Status: Married  Catering manager Violence: Not on file  Current Outpatient Medications  Medication Instructions   allopurinol  (ZYLOPRIM ) 100 mg, Oral, Daily   aspirin  (BABY ASPIRIN ) 81 mg, Oral, Daily   cloNIDine  (CATAPRES ) 0.1 mg, Oral, Daily at bedtime   colchicine  0.6 mg, Oral, 2 times daily PRN   Eszopiclone  3 mg, Oral, Daily at bedtime   FLUoxetine  (PROZAC ) 50 mg, Oral, Daily   hydrochlorothiazide  (HYDRODIURIL ) 12.5 mg, Oral, Daily   metoprolol  tartrate (LOPRESSOR ) 100 MG tablet Take 1 tablet (100 mg total) by mouth in the morning AND 1.5 tablets (150 mg total) every evening.   olmesartan  (BENICAR ) 20 mg, Oral, Daily   omeprazole  (PRILOSEC) 40 mg, Oral, Daily before breakfast   rosuvastatin  (CRESTOR ) 20 mg, Oral, Daily at bedtime       Objective:   Physical Exam BP 130/68   Pulse (!) 51   Temp 97.8 F (36.6 C) (Oral)   Resp 16    Ht 5' (1.524 m)   Wt 157 lb 2 oz (71.3 kg)   SpO2 95%   BMI 30.69 kg/m  General: Well developed, NAD, BMI noted Neck: No  thyromegaly  HEENT:  Normocephalic . Face symmetric, atraumatic Lungs:  CTA B Normal respiratory effort, no intercostal retractions, no accessory muscle use. Heart: RRR,  no murmur.  Abdomen:  Not distended, soft, non-tender. No rebound or rigidity.   Lower extremities: no pretibial edema bilaterally MSK: Hands and wrists without synovitis Skin: Exposed areas without rash. Not pale. Not jaundice Neurologic:  alert & oriented X3.  Speech normal, gait appropriate for age and unassisted Strength symmetric and appropriate for age.  Psych: Cognition and judgment appear intact.  Cooperative with normal attention span and concentration.  Behavior appropriate. No anxious or depressed appearing.     Assessment   Assessment Prediabetes  HTN Hyperlipidemia GERD Gout (podagra 06-2021, see OV note 09-05-2021) anxiety, insomnia: --Intolerant to ambien  2014 (HAs), lunesta  ok  --rx prozac  01-2015, good results H/o  migraines H/o CP (-) stress test 2009 +FH CAD, DM     PLAN Here for CPX -Tdap 2024 - zostavax 2014;  s/p Shingrex  - s/p RSV per pt  - PNM 13: 2020 ; PNM 23 : 2021;  PNM 20 today  -Vaccines I recommend: Flu shot every fall. -- female care: PAP 12/2016;  MMG 05/2022, plans to repeat MMG this year --Cscope 2006: tubular adenomas, Cscope again 06-2011, 1 polyp, not recovered; cscope 11-2016: wnl, cscope 05/2022, next per GI  -- diet-exercise: Discussed. --bones: T Score 11-2017: -1.1.  --labs:  Sed rate, AST ALT, uric acid, CBC, A1c  - Health care POA: See AVS  Other issues addressed today HTN: LOV, Dyazide was switched to HCTZ due to low BP readings.  She also takes clonidine , metoprolol , Benicar . Follow-up BMP in March was WNL. Systolic BP range from 106 to 161, the great majority of readings are very good. Headache myalgias:  Also reports more  frequent headaches (h/o migraines) and some myalgias. Plan: check sed rate, CBC, BMP, total CK  and observation for now. Gout: Had a couple of episodes after diuretic changes, recommend to call if she continue having problems.  Check a uric acid, continue allopurinol . High cholesterol: On rosuvastatin , check FLP. RTC 4 months

## 2023-07-19 ENCOUNTER — Telehealth: Payer: Self-pay | Admitting: Internal Medicine

## 2023-07-19 ENCOUNTER — Encounter: Payer: Self-pay | Admitting: Internal Medicine

## 2023-07-19 ENCOUNTER — Other Ambulatory Visit: Payer: Self-pay | Admitting: Internal Medicine

## 2023-07-19 LAB — CBC WITH DIFFERENTIAL/PLATELET
Basophils Absolute: 0.1 10*3/uL (ref 0.0–0.1)
Basophils Relative: 0.9 % (ref 0.0–3.0)
Eosinophils Absolute: 0.2 10*3/uL (ref 0.0–0.7)
Eosinophils Relative: 2.5 % (ref 0.0–5.0)
HCT: 43.5 % (ref 36.0–46.0)
Hemoglobin: 14.6 g/dL (ref 12.0–15.0)
Lymphocytes Relative: 30.9 % (ref 12.0–46.0)
Lymphs Abs: 2.1 10*3/uL (ref 0.7–4.0)
MCHC: 33.6 g/dL (ref 30.0–36.0)
MCV: 93.7 fl (ref 78.0–100.0)
Monocytes Absolute: 0.4 10*3/uL (ref 0.1–1.0)
Monocytes Relative: 6.4 % (ref 3.0–12.0)
Neutro Abs: 4.1 10*3/uL (ref 1.4–7.7)
Neutrophils Relative %: 59.3 % (ref 43.0–77.0)
Platelets: 183 10*3/uL (ref 150.0–400.0)
RBC: 4.64 Mil/uL (ref 3.87–5.11)
RDW: 13.1 % (ref 11.5–15.5)
WBC: 6.9 10*3/uL (ref 4.0–10.5)

## 2023-07-19 LAB — LIPID PANEL
Cholesterol: 159 mg/dL (ref 0–200)
HDL: 49.5 mg/dL (ref >39.00)
LDL Cholesterol: 86 mg/dL (ref 0–99)
NonHDL: 109.38
Total CHOL/HDL Ratio: 3
Triglycerides: 119 mg/dL (ref 0.0–149.0)
VLDL: 23.8 mg/dL (ref 0.0–40.0)

## 2023-07-19 LAB — URIC ACID: Uric Acid, Serum: 5.4 mg/dL (ref 2.4–7.0)

## 2023-07-19 LAB — CK: Total CK: 95 U/L (ref 7–177)

## 2023-07-19 LAB — HEMOGLOBIN A1C: Hgb A1c MFr Bld: 5.9 % (ref 4.6–6.5)

## 2023-07-19 LAB — AST: AST: 31 U/L (ref 0–37)

## 2023-07-19 LAB — ALT: ALT: 28 U/L (ref 0–35)

## 2023-07-19 NOTE — Assessment & Plan Note (Signed)
 Here for CPX   Other issues addressed today HTN: LOV, Dyazide was switched to HCTZ due to low BP readings.  She also takes clonidine , metoprolol , Benicar . Follow-up BMP in March was WNL. Systolic BP range from 106 to 409, the great majority of readings are very good. Headache myalgias:  Also reports more frequent headaches (h/o migraines) and some myalgias. Plan: check sed rate, CBC, BMP, total CK  and observation for now. Gout: Had a couple of episodes after diuretic changes, recommend to call if she continue having problems.  Check a uric acid, continue allopurinol . High cholesterol: On rosuvastatin , check FLP. RTC 4 months

## 2023-07-19 NOTE — Assessment & Plan Note (Signed)
 Here for CPX -Tdap 2024 - zostavax 2014;  s/p Shingrex  - s/p RSV per pt  - PNM 13: 2020 ; PNM 23 : 2021;  PNM 20 today  -Vaccines I recommend: Flu shot every fall. -- female care: PAP 12/2016;  MMG 05/2022, plans to repeat MMG this year --Cscope 2006: tubular adenomas, Cscope again 06-2011, 1 polyp, not recovered; cscope 11-2016: wnl, cscope 05/2022, next per GI  -- diet-exercise: Discussed. --bones: T Score 11-2017: -1.1.  --labs:  Sed rate, AST ALT, uric acid, CBC, A1c  - Health care POA: See AVS

## 2023-07-19 NOTE — Telephone Encounter (Signed)
 Requesting: eszopiclone  3mg  Contract: 01/23/23 UDS: 05/04/23 Last Visit: 07/18/23 Next Visit: 11/23/23 Last Refill: 01/09/23 #90 and 1RF   Please Advise

## 2023-07-19 NOTE — Telephone Encounter (Signed)
 PDMP okay, Rx sent

## 2023-07-20 ENCOUNTER — Ambulatory Visit: Payer: Self-pay | Admitting: Internal Medicine

## 2023-07-28 ENCOUNTER — Other Ambulatory Visit: Payer: Self-pay | Admitting: Internal Medicine

## 2023-08-02 ENCOUNTER — Telehealth: Payer: Self-pay

## 2023-08-02 NOTE — Telephone Encounter (Signed)
 Copied from CRM [id number]. Topic: Clinical - Request for Lab/Test Order >> Aug 02, 2023 11:31 AM Elle L wrote: Reason for CRM: The patient's husband is requesting an order for a bone density test for the patient. His call back number is [phone].

## 2023-08-02 NOTE — Telephone Encounter (Signed)
 LMOM informing Pt;s husband, Volanda Gruber of below.

## 2023-08-02 NOTE — Telephone Encounter (Signed)
 Our bone density machine is currently down- was planning to order in several months. Will call husband back later.

## 2023-08-29 ENCOUNTER — Other Ambulatory Visit: Payer: Self-pay

## 2023-08-29 DIAGNOSIS — Z780 Asymptomatic menopausal state: Secondary | ICD-10-CM

## 2023-09-03 ENCOUNTER — Telehealth (HOSPITAL_BASED_OUTPATIENT_CLINIC_OR_DEPARTMENT_OTHER): Payer: Self-pay

## 2023-10-01 ENCOUNTER — Ambulatory Visit (HOSPITAL_BASED_OUTPATIENT_CLINIC_OR_DEPARTMENT_OTHER)
Admission: RE | Admit: 2023-10-01 | Discharge: 2023-10-01 | Disposition: A | Discharge status: Home / Self Care | Source: Ambulatory Visit | Attending: Internal Medicine | Admitting: Internal Medicine

## 2023-10-01 ENCOUNTER — Encounter (HOSPITAL_BASED_OUTPATIENT_CLINIC_OR_DEPARTMENT_OTHER): Payer: Self-pay

## 2023-10-01 DIAGNOSIS — M85851 Other specified disorders of bone density and structure, right thigh: Secondary | ICD-10-CM | POA: Diagnosis not present

## 2023-10-01 DIAGNOSIS — Z1231 Encounter for screening mammogram for malignant neoplasm of breast: Secondary | ICD-10-CM | POA: Diagnosis not present

## 2023-10-01 DIAGNOSIS — Z780 Asymptomatic menopausal state: Secondary | ICD-10-CM | POA: Diagnosis not present

## 2023-10-07 ENCOUNTER — Ambulatory Visit: Payer: Self-pay | Admitting: Internal Medicine

## 2023-10-09 ENCOUNTER — Other Ambulatory Visit: Payer: Self-pay | Admitting: Podiatry

## 2023-10-09 ENCOUNTER — Other Ambulatory Visit: Payer: Self-pay | Admitting: Internal Medicine

## 2023-10-11 ENCOUNTER — Other Ambulatory Visit: Payer: Self-pay | Admitting: Podiatry

## 2023-11-23 ENCOUNTER — Ambulatory Visit: Admitting: Internal Medicine

## 2023-11-23 ENCOUNTER — Encounter: Payer: Self-pay | Admitting: Internal Medicine

## 2023-11-23 VITALS — BP 126/74 | HR 57 | Temp 98.1°F | Resp 16 | Ht 60.0 in | Wt 152.5 lb

## 2023-11-23 DIAGNOSIS — I10 Essential (primary) hypertension: Secondary | ICD-10-CM | POA: Diagnosis not present

## 2023-11-23 DIAGNOSIS — Z23 Encounter for immunization: Secondary | ICD-10-CM | POA: Diagnosis not present

## 2023-11-23 DIAGNOSIS — E785 Hyperlipidemia, unspecified: Secondary | ICD-10-CM | POA: Diagnosis not present

## 2023-11-23 DIAGNOSIS — F411 Generalized anxiety disorder: Secondary | ICD-10-CM | POA: Diagnosis not present

## 2023-11-23 NOTE — Patient Instructions (Addendum)
 Get your blood work  Hold clonidine  and continue checking your blood pressure.  If it goes more than 135/85 let me know  You got a flu shot today, recommend a COVID booster at the pharmacy  Make an appointment for next visit in 6 months    VISIT SUMMARY: Today we reviewed your medication management for hypertension, anxiety, and insomnia. Your blood pressure is well-controlled, and your anxiety symptoms have resolved. We also discussed your current treatment for insomnia and other health conditions.  YOUR PLAN: HYPERTENSION: Your blood pressure is well-controlled with your current medications. -Stop taking clonidine . Please monitor your blood pressure regularly to ensure it remains stable.  HYPERLIPIDEMIA: Your cholesterol levels are being managed with rosuvastatin . -Continue taking rosuvastatin  as prescribed.  GOUT: You have no current issues with gout. -Continue taking allopurinol  as prescribed.  INSOMNIA: Your insomnia is well-managed with eszopiclone . -Continue taking eszopiclone  as prescribed.  ANXIETY DISORDER: Your anxiety symptoms have resolved without the need for fluoxetine . -You can stop taking fluoxetine .

## 2023-11-23 NOTE — Progress Notes (Addendum)
 Subjective:    Patient ID: Amy Owens, female    DOB: [birth year], 68 y.o.   MRN: [id number]  DOS:  11/23/2023 Type of visit - description:  History of Present Illness Amy Owens is a 68 year old female who presents for medication management.  Hypertension management - Takes multiple antihypertensive medications:  olmesartan , clonidine , metoprolol , and hydrochlorothiazide  - Home blood pressure readings are consistent with clinic readings, typically in the 120s to 130s - Questions the necessity of multiple antihypertensive medications given stable blood pressure control  Anxiety symptoms - Run out  fluoxetine  - Feels emotionally stable since discontinuation  Insomnia - Takes eszopiclone  for sleep - Eszopiclone  is effective for insomnia  Resolved symptoms - Previous headaches and myalgias have resolved    Review of Systems See above   Past Medical History:  Diagnosis Date   Anxiety    Chest pain    atypical, neg stress test 2009   DM (diabetes mellitus) (HCC)    borderline    Hyperlipidemia    Hypertension    Insomnia    sleep disorder unspec   Migraine headache    Perimenopausal     Past Surgical History:  Procedure Laterality Date   CESAREAN SECTION     COLONOSCOPY W/ POLYPECTOMY  2006   KNEE ARTHROSCOPY  2007   TUBAL LIGATION      Current Outpatient Medications  Medication Instructions   allopurinol  (ZYLOPRIM ) 100 mg, Oral, Daily   aspirin  (BABY ASPIRIN ) 81 mg, Oral, Daily   cloNIDine  (CATAPRES ) 0.1 mg, Oral, Daily at bedtime   colchicine  0.6 mg, Oral, 2 times daily PRN   Eszopiclone  3 mg, Oral, Daily at bedtime   hydrochlorothiazide  (HYDRODIURIL ) 12.5 mg, Oral, Daily   metoprolol  tartrate (LOPRESSOR ) 100 MG tablet Take 1 tablet (100 mg total) by mouth in the morning AND 1.5 tablets (150 mg total) every evening.   olmesartan  (BENICAR ) 20 mg, Oral, Daily   omeprazole  (PRILOSEC) 40 mg, Oral, Daily before breakfast    rosuvastatin  (CRESTOR ) 20 mg, Oral, Daily at bedtime       Objective:   Physical Exam BP 126/74   Pulse (!) 57   Temp 98.1 F (36.7 C) (Oral)   Resp 16   Ht 5' (1.524 m)   Wt 152 lb 8 oz (69.2 kg)   SpO2 92%   BMI 29.78 kg/m  General:   Well developed, NAD, BMI noted. HEENT:  Normocephalic . Face symmetric, atraumatic Lungs:  CTA B Normal respiratory effort, no intercostal retractions, no accessory muscle use. Heart: RRR,  no murmur.  Lower extremities: no pretibial edema bilaterally  Skin: Not pale. Not jaundice Neurologic:  alert & oriented X3.  Speech normal, gait appropriate for age and unassisted Psych--  Cognition and judgment appear intact.  Cooperative with normal attention span and concentration.  Behavior appropriate. No anxious or depressed appearing.      Assessment   Assessment Prediabetes  HTN Hyperlipidemia GERD Gout (podagra 06-2021, see OV note 09-05-2021) anxiety, insomnia: --Intolerant to ambien  2014 (HAs), lunesta  ok  --rx prozac  01-2015, good results H/o  migraines H/o CP (-) stress test 2009 +FH CAD, DM   Assessment & Plan Hypertension Blood pressure controlled with readings in 120s to 130s. - Likes to take less medication.  will hold clonidine , other medications the same. Monitor blood pressure regularly. Hyperlipidemia Continues on rosuvastatin  without issues. Gout No current issues. Continues on allopurinol . Insomnia Managed with effective eszopiclone . Anxiety disorder Ran out of fluoxetine  and she continued to  feel well emotionally.  Stop fluoxetine . Headaches: See last visit, symptoms resolved Osteopenia: T-score -1.1, 09/2023, next in 3 to 4 years RTC 6 months

## 2023-11-24 NOTE — Assessment & Plan Note (Signed)
 Hypertension Blood pressure controlled with readings in 120s to 130s. - Likes to take less medication.  will hold clonidine , other medications the same. Monitor blood pressure regularly. Hyperlipidemia Continues on rosuvastatin  without issues. Gout No current issues. Continues on allopurinol . Insomnia Managed with effective eszopiclone . Anxiety disorder Ran out of fluoxetine  and she continued to feel well emotionally.  Stop fluoxetine . Headaches: See last visit, symptoms resolved Osteopenia: T-score -1.1, 09/2023, next in 3 to 4 years RTC 6 months

## 2023-12-04 ENCOUNTER — Other Ambulatory Visit: Payer: Self-pay | Admitting: Internal Medicine

## 2023-12-14 ENCOUNTER — Other Ambulatory Visit: Payer: Self-pay | Admitting: Podiatry

## 2024-01-01 ENCOUNTER — Other Ambulatory Visit: Payer: Self-pay | Admitting: Podiatry

## 2024-01-01 ENCOUNTER — Other Ambulatory Visit: Payer: Self-pay | Admitting: Family

## 2024-01-01 ENCOUNTER — Encounter: Payer: Self-pay | Admitting: Internal Medicine

## 2024-01-01 MED ORDER — ALLOPURINOL 100 MG PO TABS: 100.0000 mg | ORAL_TABLET | Freq: Every day | ORAL | 3 refills | Status: AC

## 2024-01-15 ENCOUNTER — Telehealth: Payer: Self-pay | Admitting: Internal Medicine

## 2024-01-18 ENCOUNTER — Other Ambulatory Visit: Payer: Self-pay | Admitting: Internal Medicine

## 2024-01-18 NOTE — Telephone Encounter (Signed)
 Requesting: eszopiclone  3mg   Contract:01/23/23 UDS:05/04/23 Last Visit: 11/23/23 Next Visit: None Last Refill: 07/19/23 #90 and 1rf   Please Advise

## 2024-01-23 NOTE — Telephone Encounter (Signed)
 Lvm to schedule

## 2024-01-23 NOTE — Telephone Encounter (Signed)
 Brianna- I'm trying to get all of Paz's patients back on the schedule for when he returns. I sent her a clinical cytogeneticist message. Will you try calling her- next visit due in March.  Thank you.

## 2024-01-25 ENCOUNTER — Other Ambulatory Visit: Payer: Self-pay | Admitting: Internal Medicine
